# Patient Record
Sex: Female | Born: 1960 | Race: White | Hispanic: No | Smoking: Never smoker
Health system: Southern US, Community
[De-identification: ages and names within clinical notes are randomized; demographics above are authoritative.]

## PROBLEM LIST (undated history)

## (undated) DIAGNOSIS — M810 Age-related osteoporosis without current pathological fracture: Secondary | ICD-10-CM

## (undated) DIAGNOSIS — J45909 Unspecified asthma, uncomplicated: Secondary | ICD-10-CM

## (undated) DIAGNOSIS — IMO0001 Reserved for inherently not codable concepts without codable children: Secondary | ICD-10-CM

## (undated) DIAGNOSIS — K5090 Crohn's disease, unspecified, without complications: Secondary | ICD-10-CM

## (undated) DIAGNOSIS — E7800 Pure hypercholesterolemia, unspecified: Secondary | ICD-10-CM

## (undated) DIAGNOSIS — C419 Malignant neoplasm of bone and articular cartilage, unspecified: Secondary | ICD-10-CM

## (undated) DIAGNOSIS — Z8739 Personal history of other diseases of the musculoskeletal system and connective tissue: Secondary | ICD-10-CM

## (undated) DIAGNOSIS — N159 Renal tubulo-interstitial disease, unspecified: Secondary | ICD-10-CM

## (undated) DIAGNOSIS — B009 Herpesviral infection, unspecified: Secondary | ICD-10-CM

## (undated) DIAGNOSIS — Z8619 Personal history of other infectious and parasitic diseases: Secondary | ICD-10-CM

## (undated) DIAGNOSIS — I10 Essential (primary) hypertension: Secondary | ICD-10-CM

## (undated) DIAGNOSIS — Z5189 Encounter for other specified aftercare: Secondary | ICD-10-CM

## (undated) DIAGNOSIS — Z853 Personal history of malignant neoplasm of breast: Secondary | ICD-10-CM

## (undated) DIAGNOSIS — Z8719 Personal history of other diseases of the digestive system: Secondary | ICD-10-CM

## (undated) DIAGNOSIS — K604 Rectal fistula: Secondary | ICD-10-CM

## (undated) DIAGNOSIS — H9192 Unspecified hearing loss, left ear: Secondary | ICD-10-CM

## (undated) DIAGNOSIS — N750 Cyst of Bartholin's gland: Secondary | ICD-10-CM

## (undated) DIAGNOSIS — Z8742 Personal history of other diseases of the female genital tract: Secondary | ICD-10-CM

## (undated) DIAGNOSIS — Z872 Personal history of diseases of the skin and subcutaneous tissue: Secondary | ICD-10-CM

## (undated) DIAGNOSIS — IMO0002 Reserved for concepts with insufficient information to code with codable children: Secondary | ICD-10-CM

## (undated) DIAGNOSIS — E079 Disorder of thyroid, unspecified: Secondary | ICD-10-CM

## (undated) HISTORY — DX: Malignant neoplasm of bone and articular cartilage, unspecified: C41.9

## (undated) HISTORY — DX: Personal history of malignant neoplasm of breast: Z85.3

## (undated) HISTORY — DX: Herpesviral infection, unspecified: B00.9

## (undated) HISTORY — DX: Cyst of Bartholin's gland: N75.0

## (undated) HISTORY — PX: BREAST REDUCTION SURGERY: SHX8

## (undated) HISTORY — PX: BREAST BIOPSY: SHX20

## (undated) HISTORY — DX: Personal history of diseases of the skin and subcutaneous tissue: Z87.2

## (undated) HISTORY — DX: Personal history of other diseases of the female genital tract: Z87.42

## (undated) HISTORY — DX: Age-related osteoporosis without current pathological fracture: M81.0

## (undated) HISTORY — DX: Crohn's disease, unspecified, without complications: K50.90

## (undated) HISTORY — DX: Personal history of other diseases of the digestive system: Z87.19

## (undated) HISTORY — PX: THYROIDECTOMY: SHX17

## (undated) HISTORY — PX: BARTHOLIN CYST MARSUPIALIZATION: SHX5383

## (undated) HISTORY — PX: MASTECTOMY: SHX3

## (undated) HISTORY — DX: Reserved for concepts with insufficient information to code with codable children: IMO0002

## (undated) HISTORY — DX: Rectal fistula: K60.4

## (undated) HISTORY — DX: Renal tubulo-interstitial disease, unspecified: N15.9

## (undated) HISTORY — DX: Encounter for other specified aftercare: Z51.89

## (undated) HISTORY — PX: OTHER SURGICAL HISTORY: SHX169

## (undated) HISTORY — DX: Pure hypercholesterolemia, unspecified: E78.00

## (undated) HISTORY — DX: Reserved for inherently not codable concepts without codable children: IMO0001

## (undated) HISTORY — DX: Personal history of other diseases of the musculoskeletal system and connective tissue: Z87.39

## (undated) HISTORY — PX: ECTOPIC PREGNANCY SURGERY: SHX613

## (undated) HISTORY — DX: Personal history of other infectious and parasitic diseases: Z86.19

## (undated) HISTORY — DX: Unspecified hearing loss, left ear: H91.92

---
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
## 1984-10-06 DIAGNOSIS — K5090 Crohn's disease, unspecified, without complications: Secondary | ICD-10-CM

## 1984-10-06 HISTORY — DX: Crohn's disease, unspecified, without complications: K50.90

## 1989-04-05 DIAGNOSIS — Z8719 Personal history of other diseases of the digestive system: Secondary | ICD-10-CM

## 1989-04-05 HISTORY — DX: Personal history of other diseases of the digestive system: Z87.19

## 1989-04-05 HISTORY — PX: RECTAL SURGERY: SHX760

## 1995-12-07 DIAGNOSIS — Z8719 Personal history of other diseases of the digestive system: Secondary | ICD-10-CM

## 1995-12-07 HISTORY — DX: Personal history of other diseases of the digestive system: Z87.19

## 1996-12-06 DIAGNOSIS — N750 Cyst of Bartholin's gland: Secondary | ICD-10-CM

## 1996-12-06 HISTORY — DX: Cyst of Bartholin's gland: N75.0

## 1997-02-03 HISTORY — PX: OTHER SURGICAL HISTORY: SHX169

## 1997-02-03 HISTORY — PX: SALPINGOOPHORECTOMY: SHX82

## 1997-12-06 HISTORY — PX: OTHER SURGICAL HISTORY: SHX169

## 1998-12-06 DIAGNOSIS — Z872 Personal history of diseases of the skin and subcutaneous tissue: Secondary | ICD-10-CM

## 1998-12-06 HISTORY — DX: Personal history of diseases of the skin and subcutaneous tissue: Z87.2

## 1999-12-07 DIAGNOSIS — K604 Rectal fistula, unspecified: Secondary | ICD-10-CM

## 1999-12-07 DIAGNOSIS — Z8739 Personal history of other diseases of the musculoskeletal system and connective tissue: Secondary | ICD-10-CM

## 1999-12-07 DIAGNOSIS — Z8742 Personal history of other diseases of the female genital tract: Secondary | ICD-10-CM

## 1999-12-07 DIAGNOSIS — H9192 Unspecified hearing loss, left ear: Secondary | ICD-10-CM

## 1999-12-07 HISTORY — DX: Personal history of other diseases of the musculoskeletal system and connective tissue: Z87.39

## 1999-12-07 HISTORY — DX: Rectal fistula: K60.4

## 1999-12-07 HISTORY — DX: Unspecified hearing loss, left ear: H91.92

## 1999-12-07 HISTORY — DX: Personal history of other diseases of the female genital tract: Z87.42

## 1999-12-07 HISTORY — DX: Rectal fistula, unspecified: K60.40

## 2002-12-06 HISTORY — PX: ABDOMINAL HYSTERECTOMY: SHX81

## 2003-08-07 HISTORY — PX: BOWEL RESECTION: SHX1257

## 2007-09-07 ENCOUNTER — Emergency Department (HOSPITAL_COMMUNITY): Admission: EM | Admit: 2007-09-07 | Discharge: 2007-09-07 | Payer: Self-pay | Admitting: Emergency Medicine

## 2008-12-06 DIAGNOSIS — R87619 Unspecified abnormal cytological findings in specimens from cervix uteri: Secondary | ICD-10-CM

## 2008-12-06 DIAGNOSIS — IMO0002 Reserved for concepts with insufficient information to code with codable children: Secondary | ICD-10-CM

## 2008-12-06 HISTORY — DX: Reserved for concepts with insufficient information to code with codable children: IMO0002

## 2008-12-06 HISTORY — DX: Unspecified abnormal cytological findings in specimens from cervix uteri: R87.619

## 2010-12-06 DIAGNOSIS — Z853 Personal history of malignant neoplasm of breast: Secondary | ICD-10-CM

## 2010-12-06 HISTORY — DX: Personal history of malignant neoplasm of breast: Z85.3

## 2011-04-17 ENCOUNTER — Emergency Department (HOSPITAL_BASED_OUTPATIENT_CLINIC_OR_DEPARTMENT_OTHER)
Admission: EM | Admit: 2011-04-17 | Discharge: 2011-04-17 | Disposition: A | Discharge status: Home / Self Care | Payer: BC Managed Care – PPO | Attending: Emergency Medicine | Admitting: Emergency Medicine

## 2011-04-17 ENCOUNTER — Emergency Department (INDEPENDENT_AMBULATORY_CARE_PROVIDER_SITE_OTHER): Payer: BC Managed Care – PPO

## 2011-04-17 DIAGNOSIS — S91109A Unspecified open wound of unspecified toe(s) without damage to nail, initial encounter: Secondary | ICD-10-CM

## 2011-04-17 DIAGNOSIS — W208XXA Other cause of strike by thrown, projected or falling object, initial encounter: Secondary | ICD-10-CM

## 2011-04-27 ENCOUNTER — Emergency Department (HOSPITAL_BASED_OUTPATIENT_CLINIC_OR_DEPARTMENT_OTHER)
Admission: EM | Admit: 2011-04-27 | Discharge: 2011-04-27 | Disposition: A | Discharge status: Home / Self Care | Payer: BC Managed Care – PPO | Attending: Emergency Medicine | Admitting: Emergency Medicine

## 2011-04-27 DIAGNOSIS — Z4802 Encounter for removal of sutures: Secondary | ICD-10-CM | POA: Insufficient documentation

## 2011-05-07 HISTORY — PX: MASTECTOMY: SHX3

## 2011-09-16 LAB — POCT I-STAT CREATININE: Creatinine, Ser: 0.6

## 2011-09-16 LAB — POCT CARDIAC MARKERS
CKMB, poc: 2.1
CKMB, poc: 3.1
Operator id: 277751
Troponin i, poc: <0.05
Troponin i, poc: <0.05

## 2011-09-16 LAB — I-STAT 8, (EC8 V) (CONVERTED LAB)
BUN: 10
Bicarbonate: 28.6 — ABNORMAL HIGH
Glucose, Bld: 99
Potassium: 3.6
Sodium: 141
TCO2: 30
pH, Ven: 7.382 — ABNORMAL HIGH

## 2011-09-16 LAB — DIFFERENTIAL
Basophils Absolute: 0
Basophils Relative: 1
Eosinophils Absolute: 0.1

## 2011-09-16 LAB — CBC
HCT: 39.4
Hemoglobin: 13.6
RBC: 4.35
WBC: 6.5

## 2012-01-03 HISTORY — PX: BREAST RECONSTRUCTION: SHX9

## 2012-03-01 ENCOUNTER — Encounter: Payer: Self-pay | Admitting: Obstetrics and Gynecology

## 2012-03-13 ENCOUNTER — Encounter (INDEPENDENT_AMBULATORY_CARE_PROVIDER_SITE_OTHER): Payer: BC Managed Care – PPO | Admitting: Obstetrics and Gynecology

## 2012-03-13 ENCOUNTER — Other Ambulatory Visit: Payer: Self-pay | Admitting: Obstetrics and Gynecology

## 2012-03-13 DIAGNOSIS — Z01419 Encounter for gynecological examination (general) (routine) without abnormal findings: Secondary | ICD-10-CM

## 2012-03-13 DIAGNOSIS — Z139 Encounter for screening, unspecified: Secondary | ICD-10-CM

## 2012-03-14 LAB — TSH: TSH: 1.862 u[IU]/mL (ref 0.350–4.500)

## 2012-03-27 ENCOUNTER — Telehealth: Payer: Self-pay

## 2012-03-27 NOTE — Telephone Encounter (Signed)
Call pt. Re: VIT-D protocol, rx was called to [facility].PG CMA

## 2012-03-27 NOTE — Telephone Encounter (Signed)
Pt.was called re: VI-D protocol, rx was called to [facility] for VIT-D 50,000 units 1cap 1x wk for 12 wks #20 0rf PG CMA

## 2012-04-27 ENCOUNTER — Other Ambulatory Visit: Payer: Self-pay | Admitting: Endocrinology

## 2012-04-27 DIAGNOSIS — E049 Nontoxic goiter, unspecified: Secondary | ICD-10-CM

## 2012-05-03 ENCOUNTER — Ambulatory Visit: Payer: Self-pay | Admitting: Obstetrics and Gynecology

## 2012-05-05 ENCOUNTER — Ambulatory Visit
Admission: RE | Admit: 2012-05-05 | Discharge: 2012-05-05 | Disposition: A | Discharge status: Home / Self Care | Payer: BC Managed Care – PPO | Source: Ambulatory Visit | Attending: Endocrinology | Admitting: Endocrinology

## 2012-05-05 DIAGNOSIS — E049 Nontoxic goiter, unspecified: Secondary | ICD-10-CM

## 2012-06-12 ENCOUNTER — Telehealth: Payer: Self-pay

## 2012-06-12 NOTE — Telephone Encounter (Signed)
Spoke to  Pt about Vit. D recall. She is going to check to see if Vit D level was checked on recent labs through [facility]. We"ll talk again tomorrow and sched f/u if need be. Also pt was asking about thyroid u/s. Results. Biopsy is being recommended. Pt has not heard any results from Dr. Talmage Nap or our office. I told her I'd look to see if I have any result notes from [facility] re: results and rec. And get back to her. Melody Comas A

## 2012-06-15 ENCOUNTER — Other Ambulatory Visit: Payer: Self-pay

## 2012-06-15 DIAGNOSIS — E559 Vitamin D deficiency, unspecified: Secondary | ICD-10-CM

## 2012-06-26 ENCOUNTER — Other Ambulatory Visit: Payer: BC Managed Care – PPO

## 2012-06-26 DIAGNOSIS — E559 Vitamin D deficiency, unspecified: Secondary | ICD-10-CM

## 2012-06-27 ENCOUNTER — Other Ambulatory Visit: Payer: Self-pay | Admitting: Endocrinology

## 2012-06-27 DIAGNOSIS — E041 Nontoxic single thyroid nodule: Secondary | ICD-10-CM

## 2012-06-27 LAB — VITAMIN D 25 HYDROXY (VIT D DEFICIENCY, FRACTURES): Vit D, 25-Hydroxy: 29 ng/mL — ABNORMAL LOW (ref 30–89)

## 2012-07-04 ENCOUNTER — Other Ambulatory Visit (HOSPITAL_COMMUNITY)
Admission: RE | Admit: 2012-07-04 | Discharge: 2012-07-04 | Disposition: A | Discharge status: Home / Self Care | Payer: BC Managed Care – PPO | Source: Ambulatory Visit | Attending: Interventional Radiology | Admitting: Interventional Radiology

## 2012-07-04 ENCOUNTER — Ambulatory Visit
Admission: RE | Admit: 2012-07-04 | Discharge: 2012-07-04 | Disposition: A | Discharge status: Home / Self Care | Payer: BC Managed Care – PPO | Source: Ambulatory Visit | Attending: Endocrinology | Admitting: Endocrinology

## 2012-07-04 DIAGNOSIS — E041 Nontoxic single thyroid nodule: Secondary | ICD-10-CM

## 2012-07-04 DIAGNOSIS — E049 Nontoxic goiter, unspecified: Secondary | ICD-10-CM | POA: Insufficient documentation

## 2012-07-24 ENCOUNTER — Telehealth: Payer: Self-pay

## 2012-07-24 NOTE — Telephone Encounter (Signed)
LM for pt to CB re labb tests. She needs Vitamin D protocol.  I gave her my direct ext. Melody Comas A

## 2012-07-25 ENCOUNTER — Telehealth: Payer: Self-pay

## 2012-07-25 NOTE — Telephone Encounter (Signed)
Took call from pt who was calling for test results. Vit D still low at 29. She has been on Vitamin D protocol. Her result was the same in April.She actually has thyroid cancer now and wonders whether the cancer is affecting her ability to respond to the Vitamin D regimen. I told her to direct that ? To her Endocrinologist. Pt is agreeable. Melody Comas A

## 2013-02-20 IMAGING — CR DG FOOT COMPLETE 3+V*L*
3 series · 3 of 3 positions shown · non-contrast
Comparison: None.

CLINICAL DATA: Blunt trauma to the left second toe.

LEFT FOOT - COMPLETE 3+ VIEW

[t foot ap left]
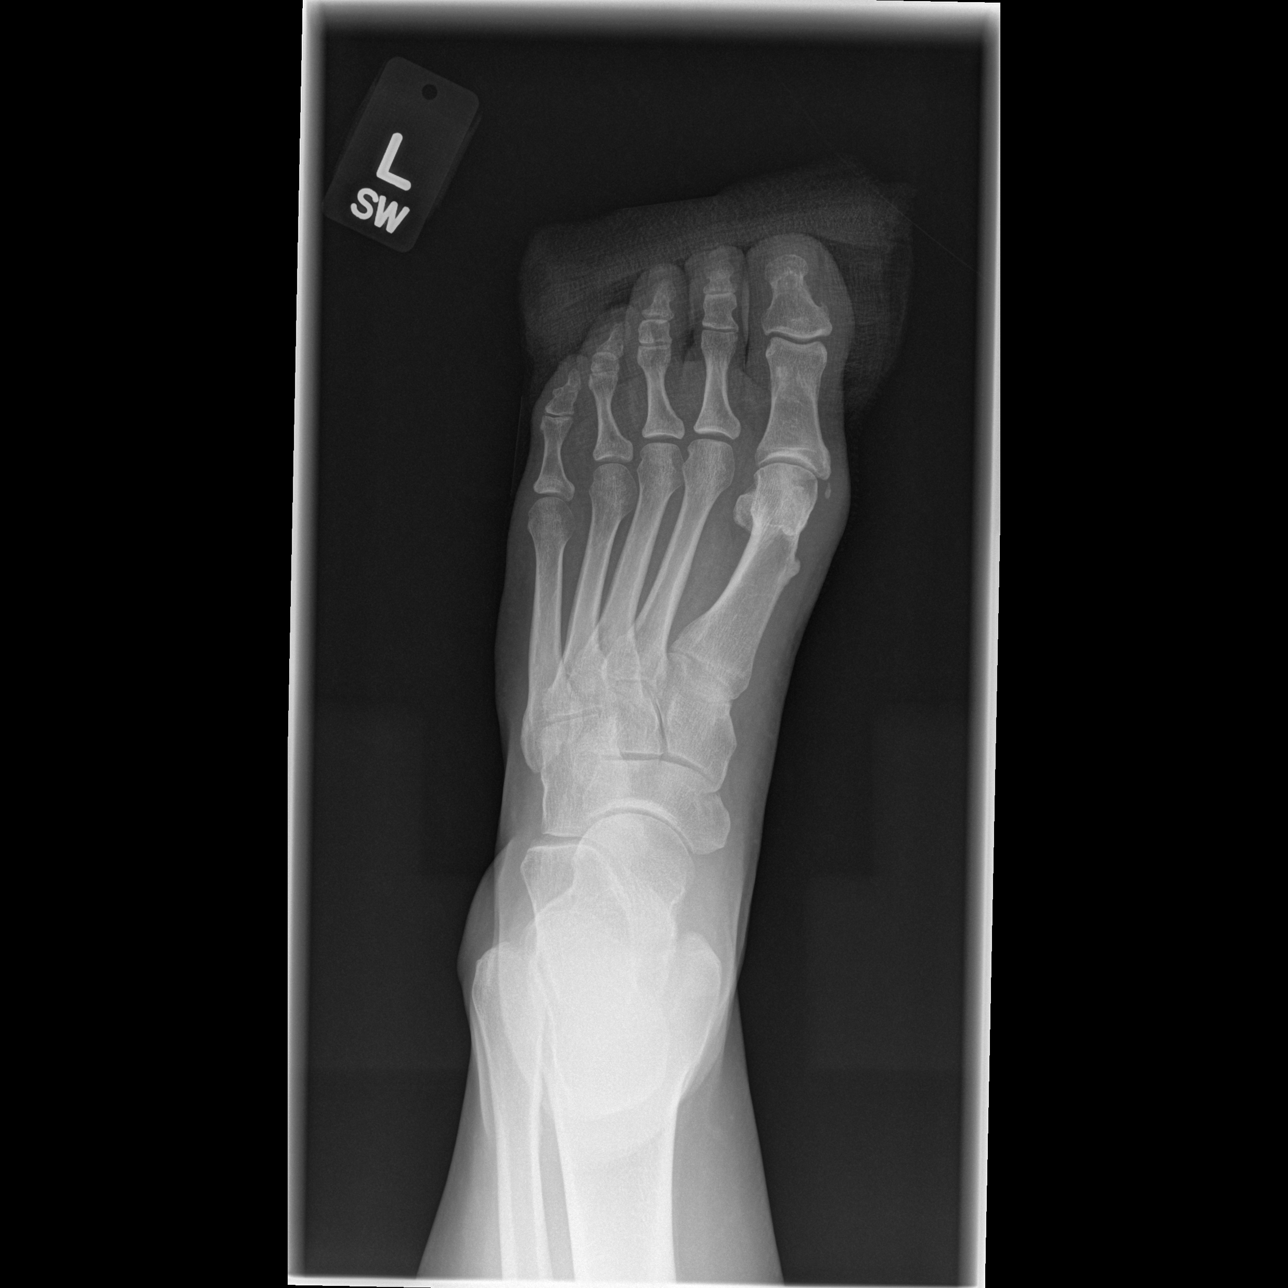

[t foot oblique left]
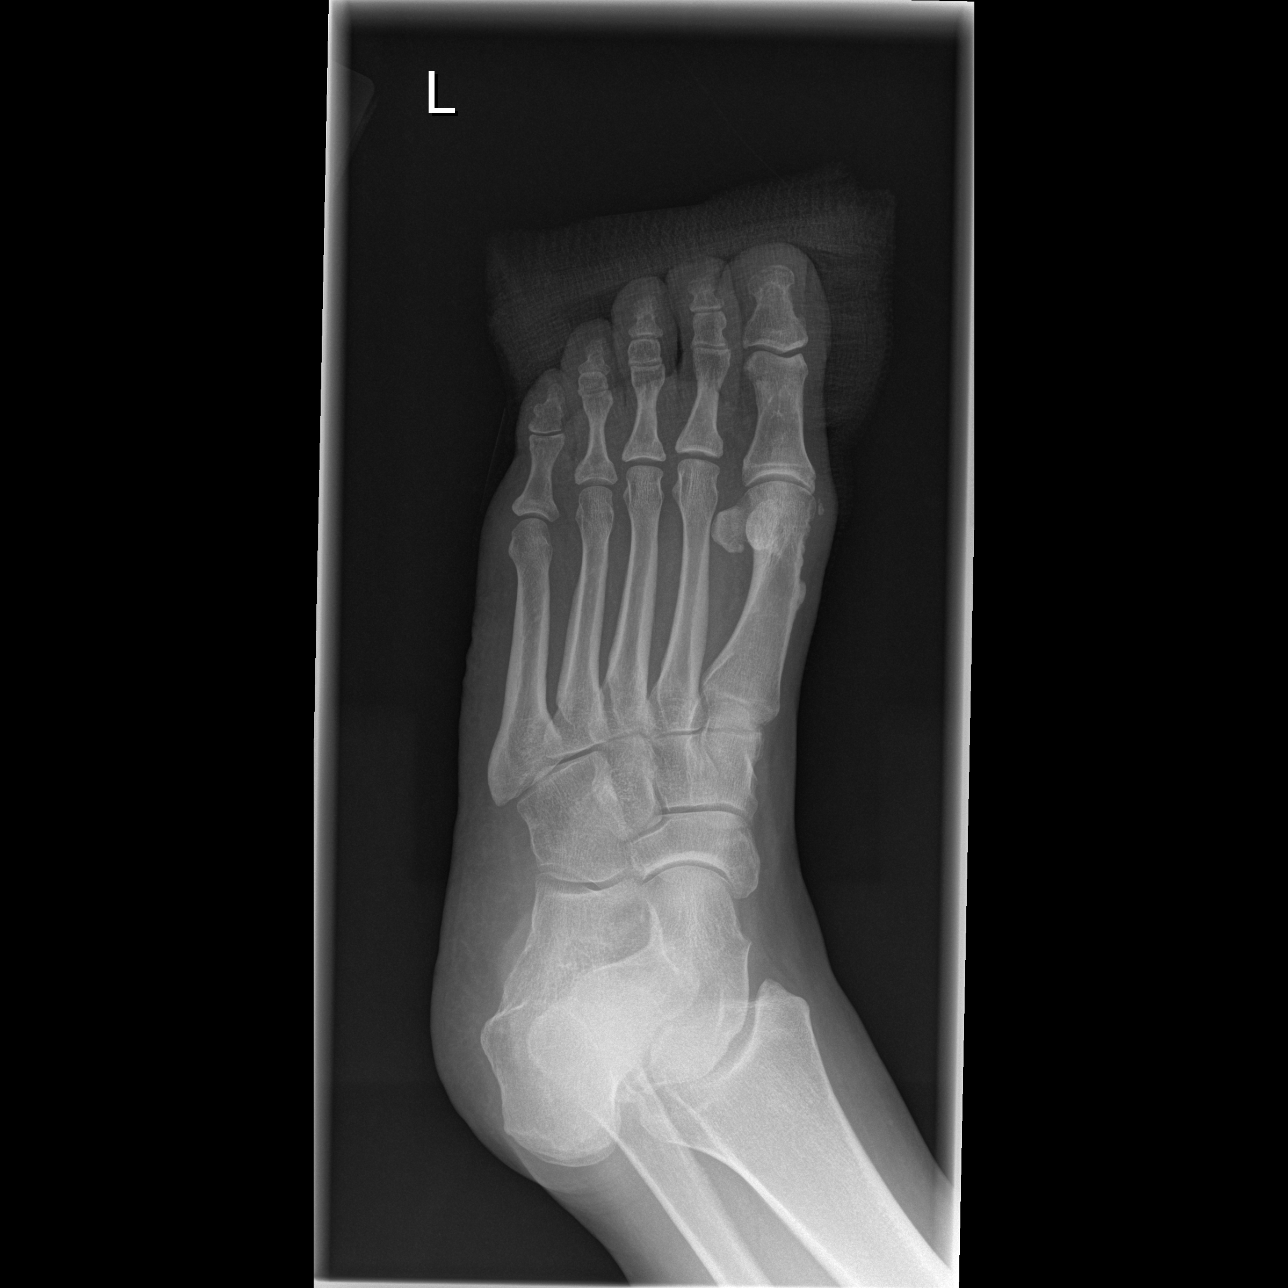

[t foot lat left]
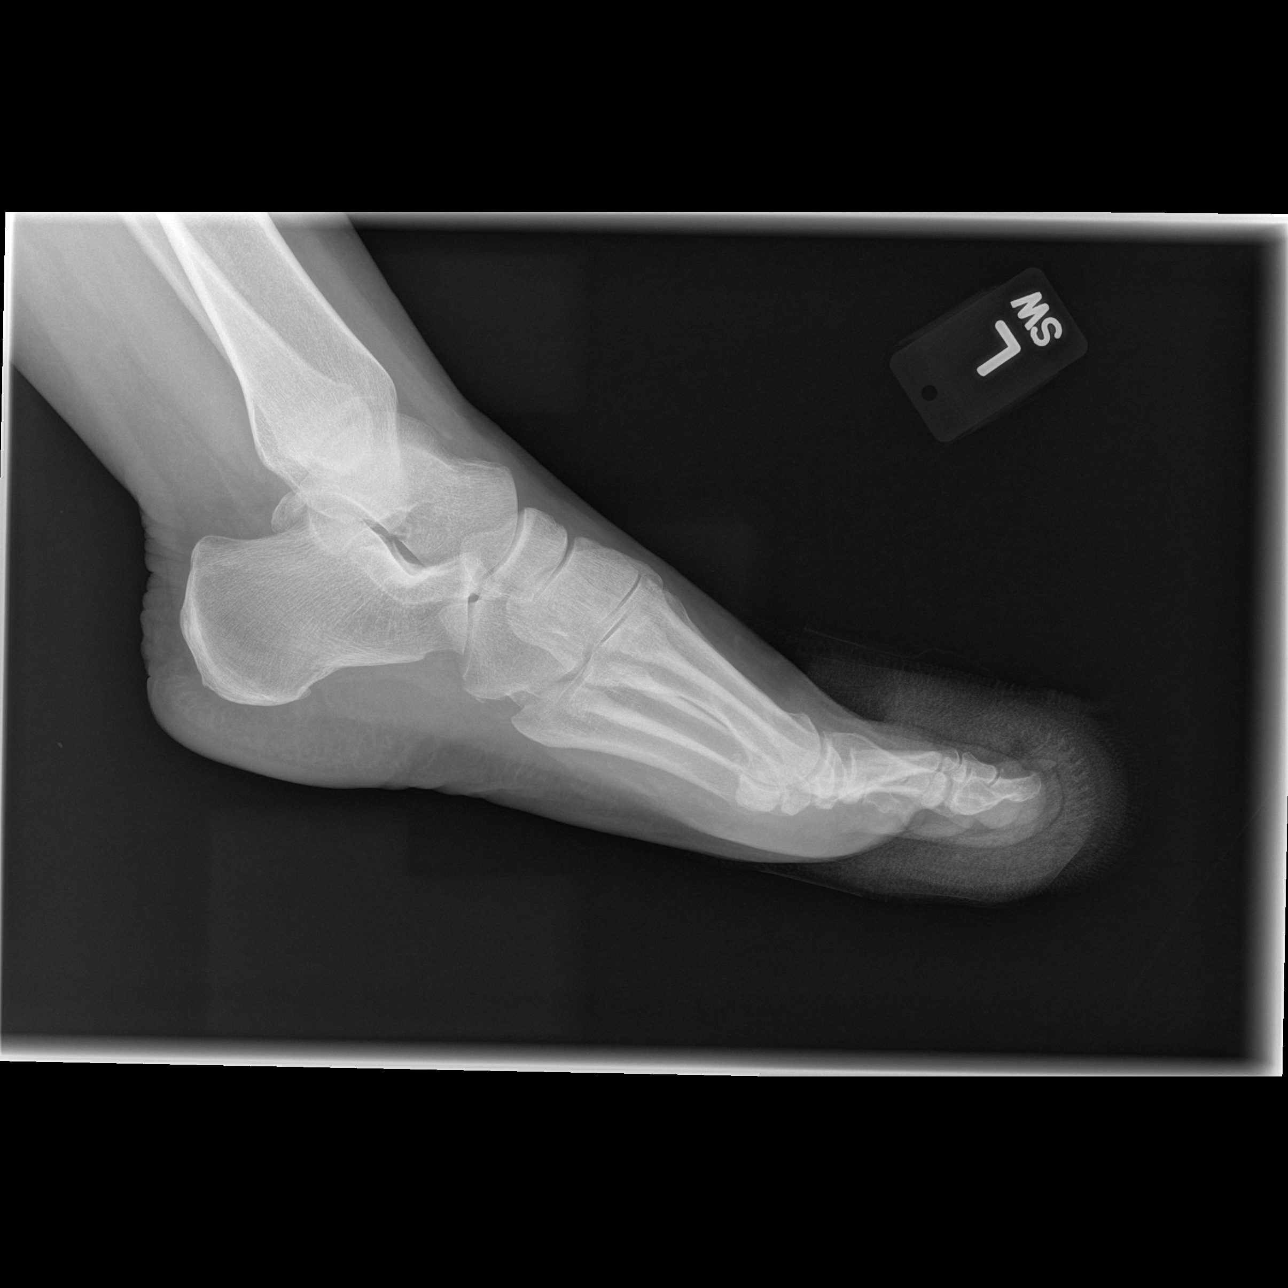

[3 of 3 positions shown; findings below may reference images not displayed]

FINDINGS: There is no evidence for acute fracture.  No subluxation or
dislocation.  Postoperative changes are seen in the first
metatarsal.  Soft tissue irregularity at the tip of the second toe
is probably secondary to a laceration.
IMPRESSION: Fine detail is obscured by the overlying gauze.  No gross fracture
or dislocation is evident.

## 2013-03-29 IMAGING — US US SOFT TISSUE HEAD/NECK
1 series · 13 of 25 positions shown · non-contrast
Comparison: CT chest of 09/07/2007

CLINICAL DATA: Enlarged thyroid on physical exam

THYROID ULTRASOUND
TECHNIQUE: Ultrasound examination of the thyroid gland and adjacent
soft tissues was performed.

[Series 1: us soft tissue head/neck · 0.05mm/px · 13 of 62 slices shown]
[im 1/62]
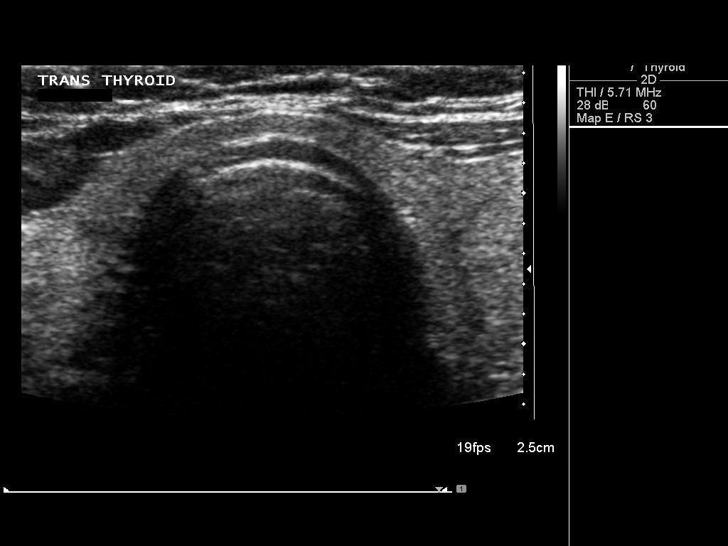
[im 6/62]
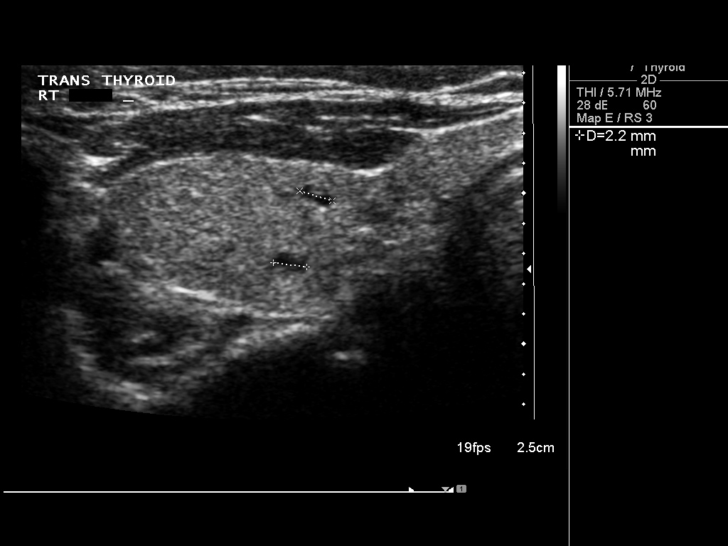
[im 11/62]
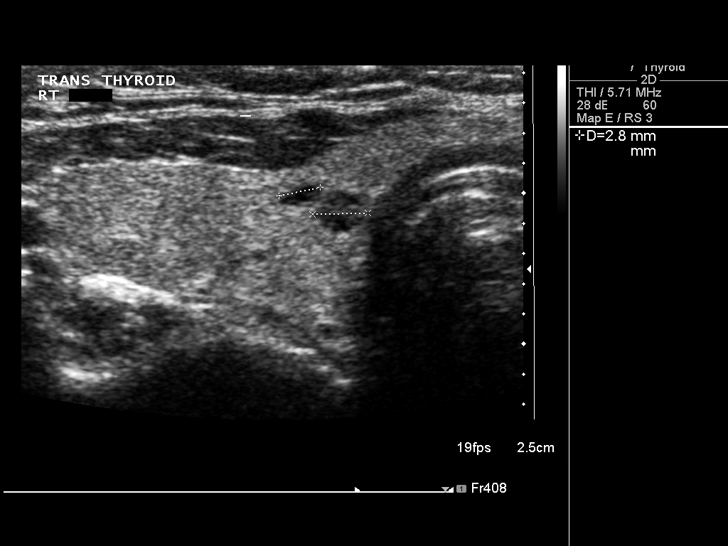
[im 16/62]
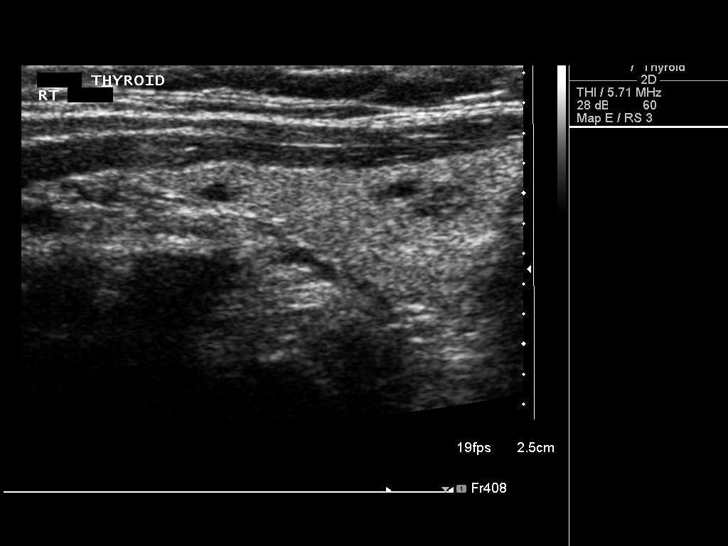
[im 21/62]
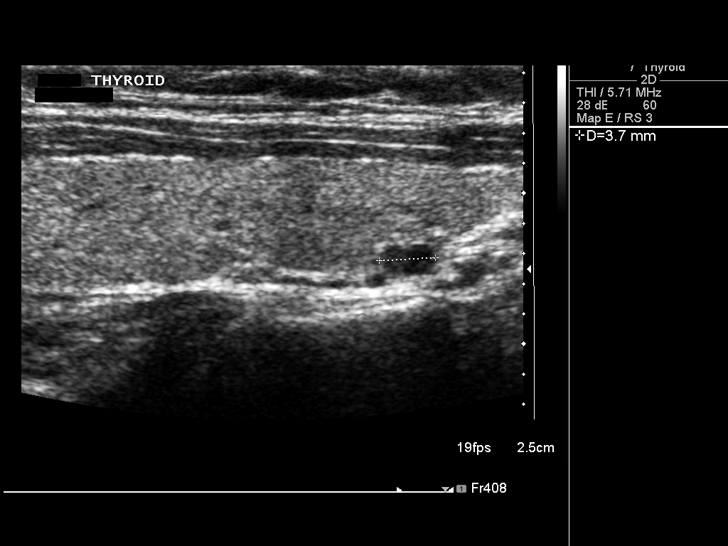
[im 26/62]
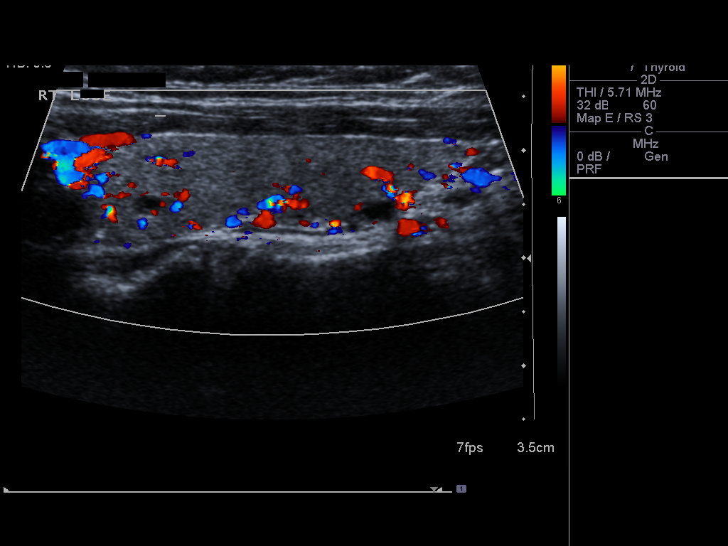
[im 31/62]
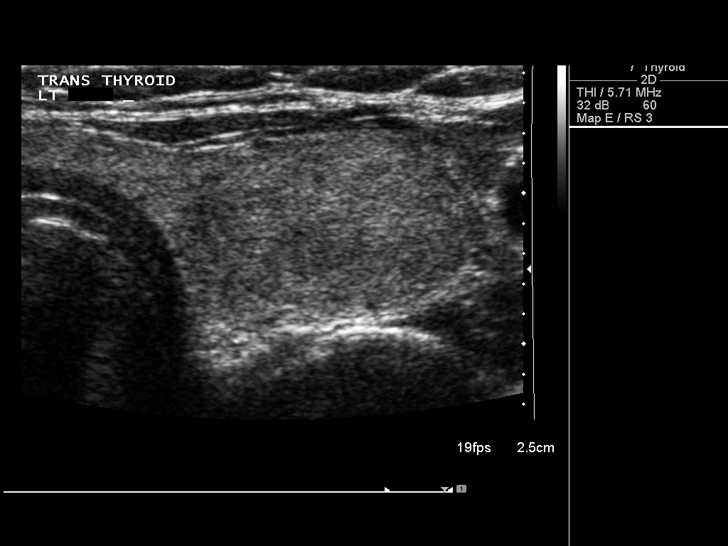
[im 36/62]
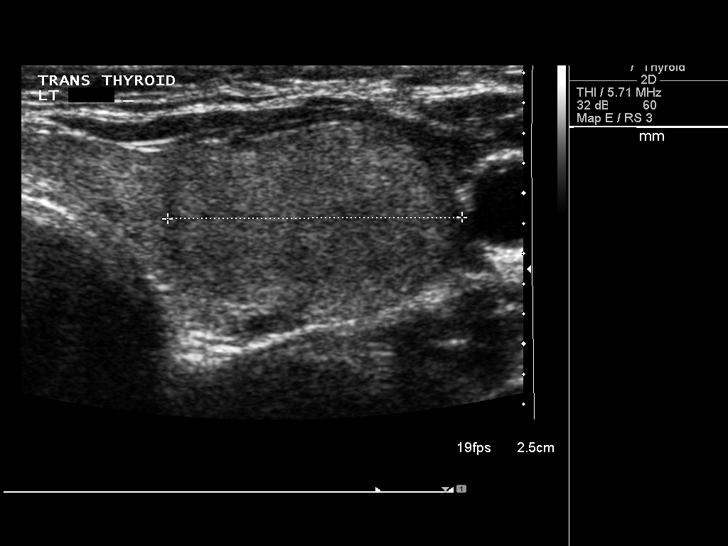
[im 41/62]
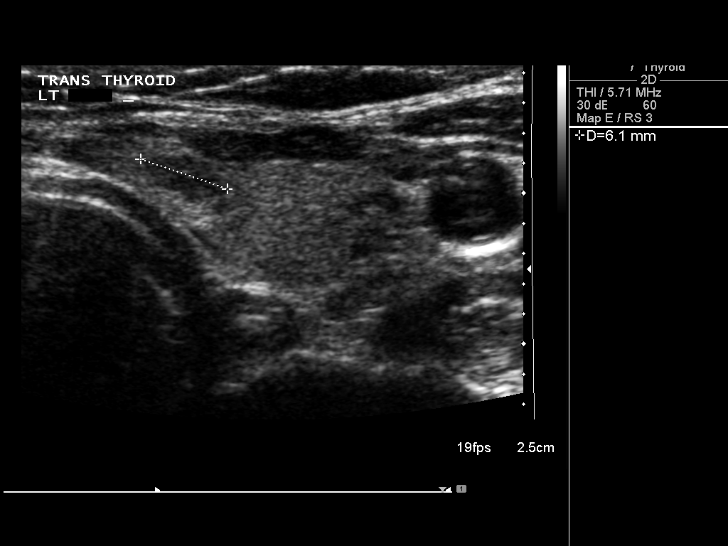
[im 46/62]
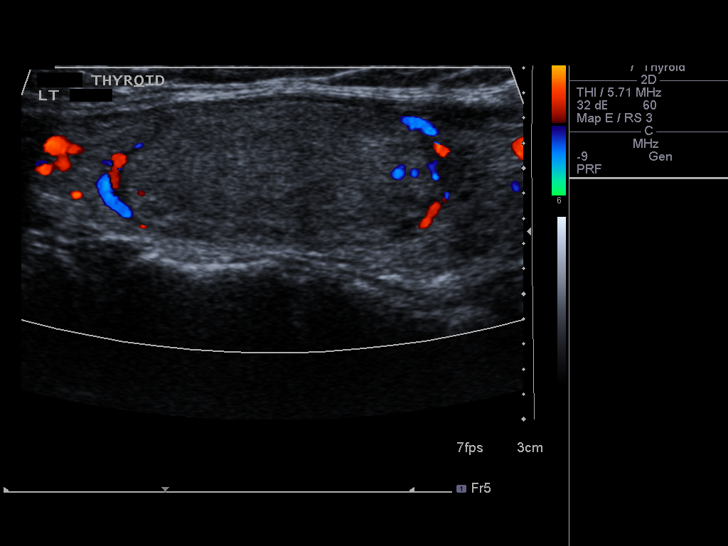
[im 51/62]
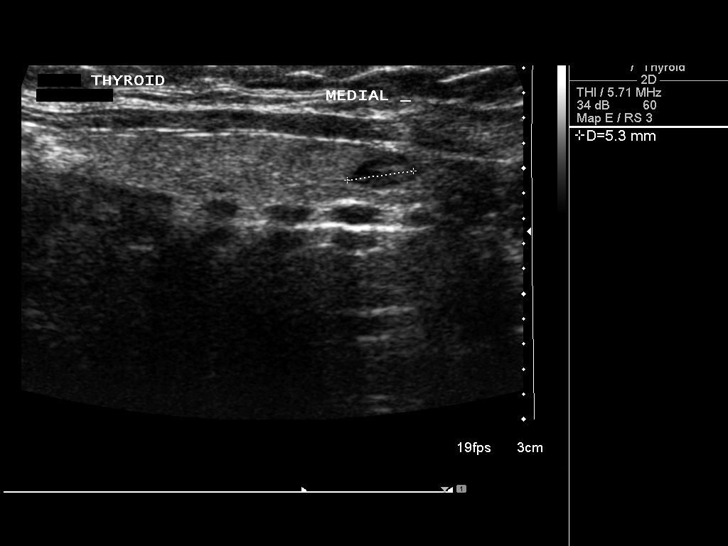
[im 56/62]
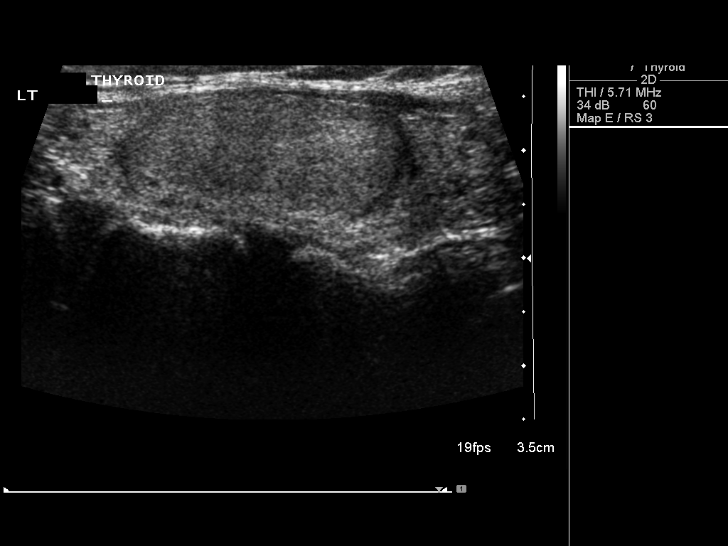
[im 62/62]
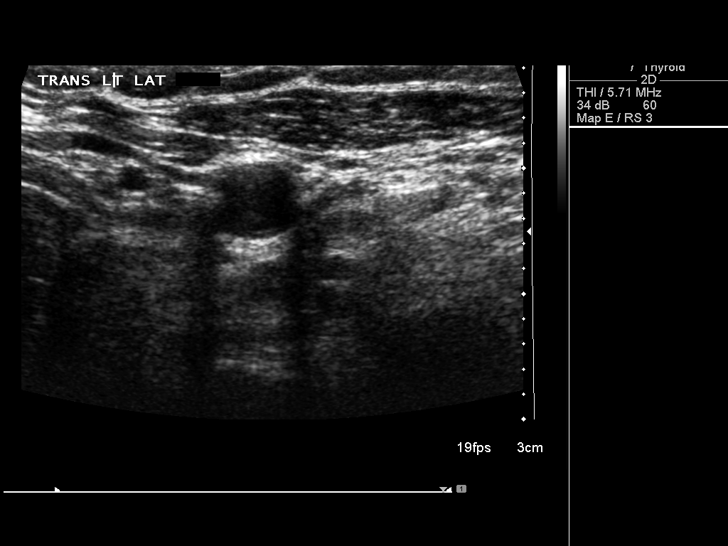

[13 of 25 positions shown; findings below may reference images not displayed]

FINDINGS: Right thyroid lobe:  4.4 x 1.0 x 2.2 cm.
Left thyroid lobe:  4.2 x 1.2 x 2.2 cm.
Isthmus:  2.3 mm in thickness.

Focal nodules:  The echogenicity of the thyroid gland is
homogeneous.  There is a dominant solid nodule within the left lobe
of thyroid of 2.7 x 1.2 x 2.0 cm.  In review of the CT from 3226,
this thyroid nodule was present at that time measuring 1.4 x 1.5 x
2.1 cm. Findings meet consensus criteria for biopsy.  Ultrasound-
guided fine needle aspiration should be considered, as per the
consensus statement: Management of Thyroid Nodules Detected at US:
Society of Radiologists in Ultrasound Consensus Conference
Statement.  Radiology 3005; [DATE]. Biopsy of this thyroid
nodule is recommended.  Smaller nodules are present bilaterally of
no more than 6 mm in diameter and are largely hypoechoic.

Lymphadenopathy:  None visualized.
IMPRESSION: Solid dominant nodule in the left lobe of 2.7 x 1.2 x 2.0 cm.
Recommend biopsy of this dominant lesion.

## 2013-05-28 IMAGING — US US THYROID BIOPSY
1 series · 12 of 12 positions shown · non-contrast
Comparison: none

CLINICAL DATA: Palpable left thyroid lesion, solid on ultrasound..

ULTRASOUND-GUIDED THYROID ASPIRATION BIOPSY
TECHNIQUE: Survey ultrasound was performed and the dominant lesion
in the mid-left lobe was localized.  An appropriate skin entry site
was determined.  Skin was marked, then prepped with Betadine,
draped in usual sterile fashion, and infiltrated locally with 1%
lidocaine.  Under real-time ultrasound guidance, 4  passes were
made into the lesion with 25 gauge needles.  The patient tolerated
procedure well, with no immediate complications.
IMPRESSION
1.  Technically successful ultrasound-guided thyroid aspiration
biopsy

[Series 1: us thyroid biopsy · 0.05mm/px · 12 acquisitions, 12 frames shown]
[im 1/12]
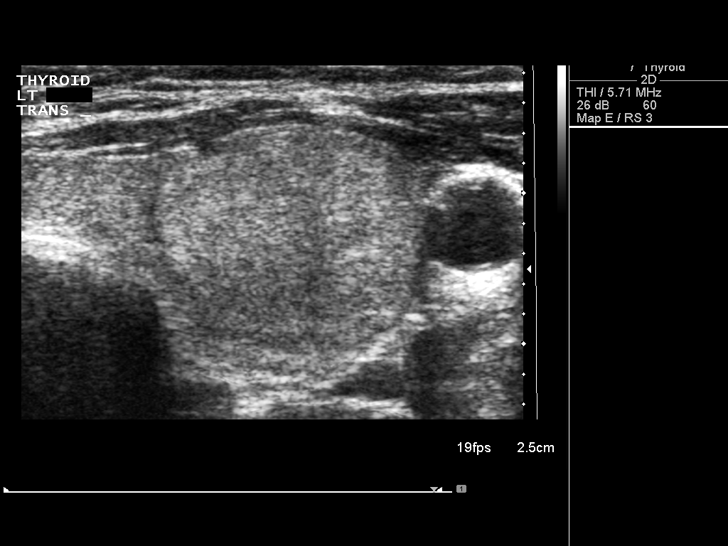
[im 2/12]
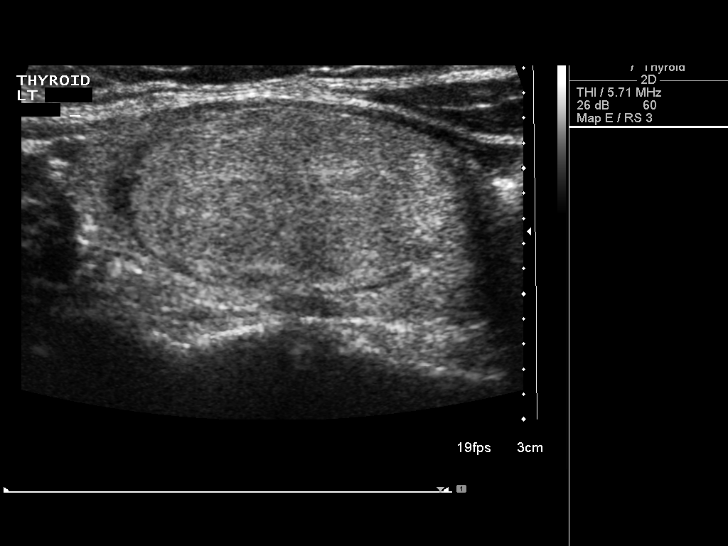
[im 3/12]
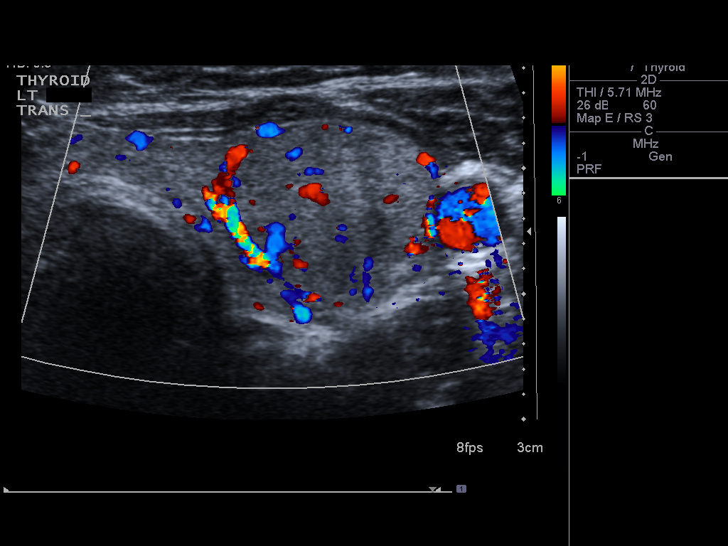
[im 4/12]
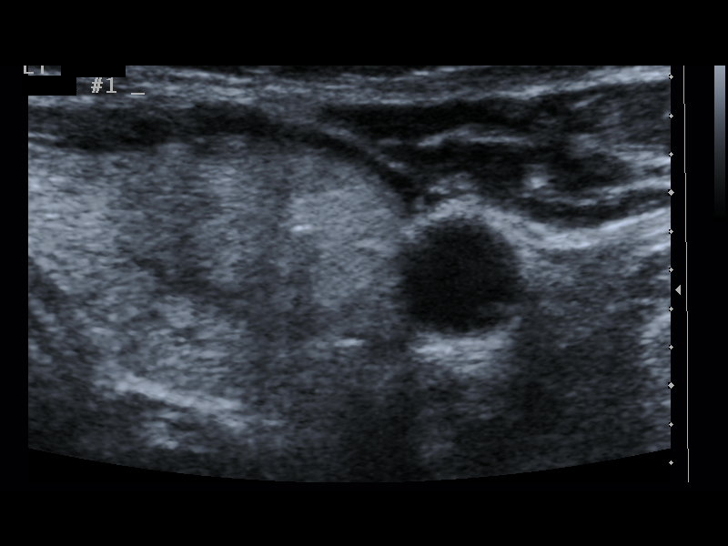
[im 5/12]
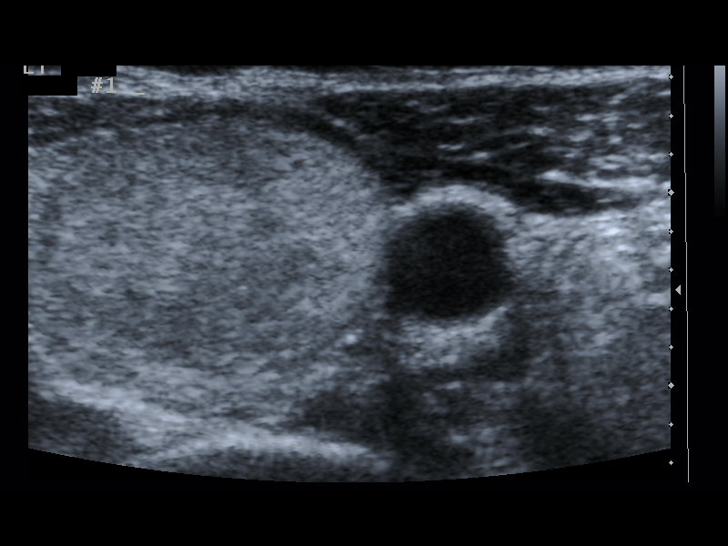
[im 6/12]
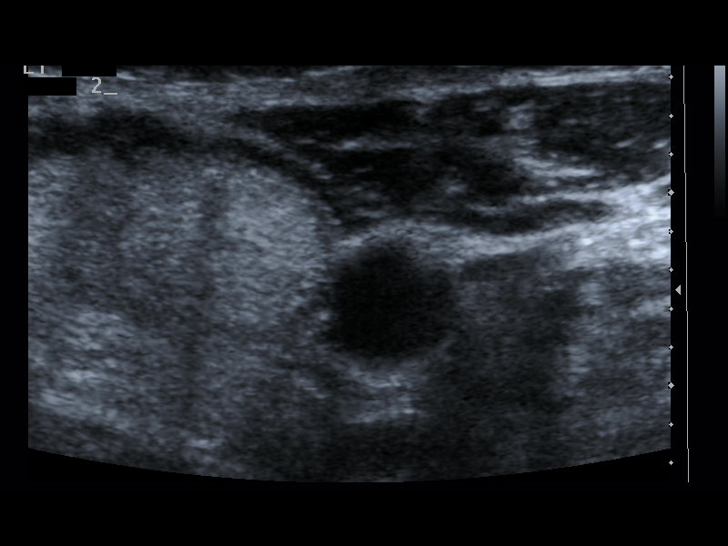
[im 7/12]
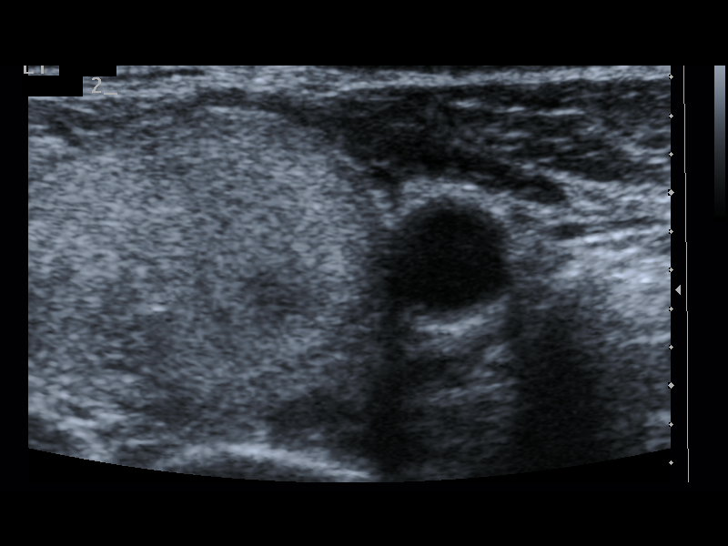
[im 8/12]
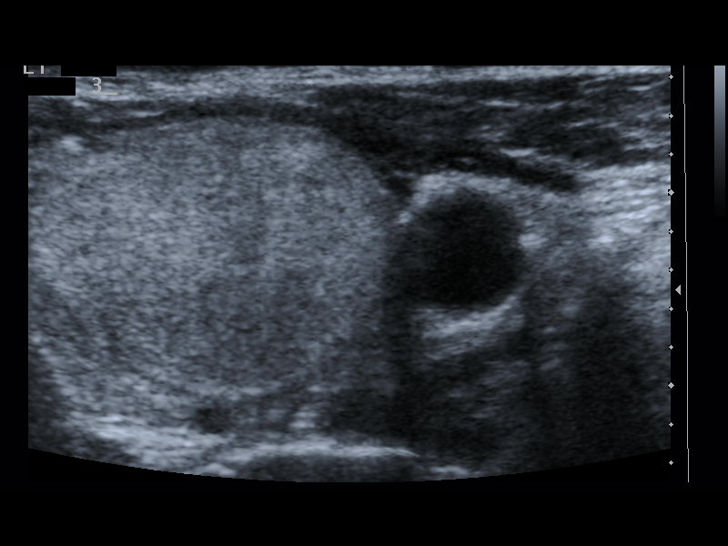
[im 9/12]
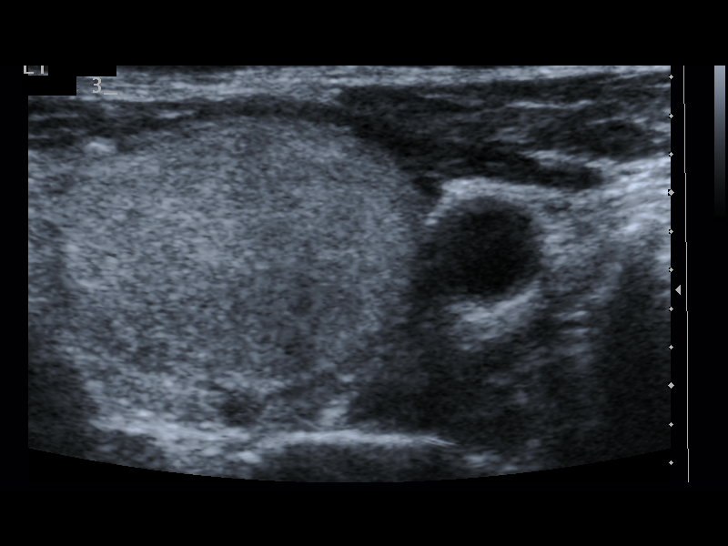
[im 10/12]
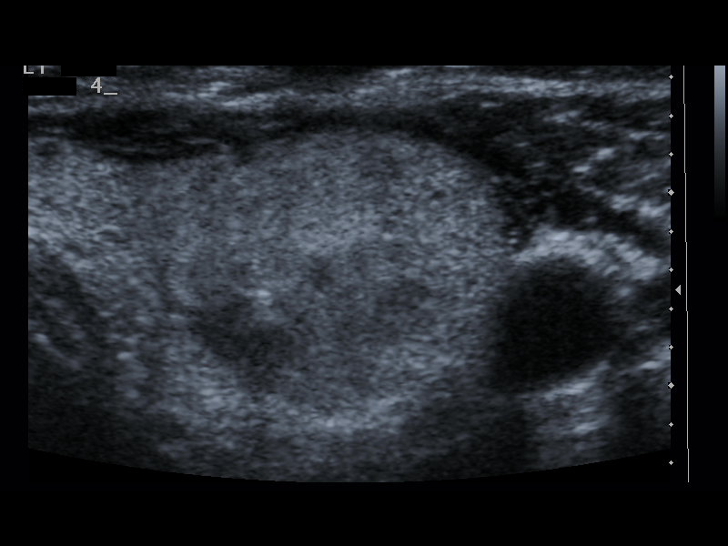
[im 11/12]
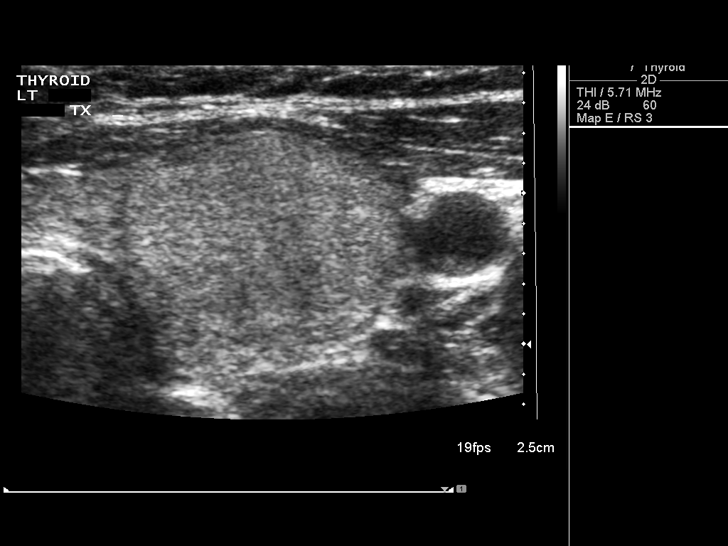
[im 12/12]
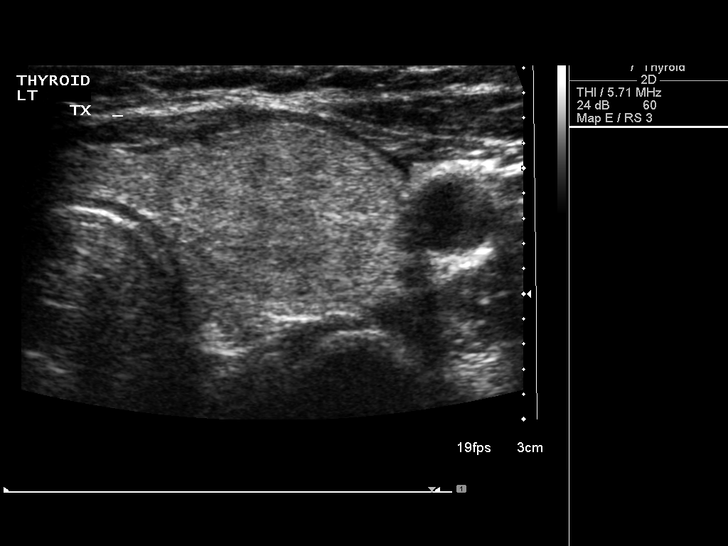

[12 of 12 positions shown; findings below may reference images not displayed]

## 2013-06-21 ENCOUNTER — Encounter (INDEPENDENT_AMBULATORY_CARE_PROVIDER_SITE_OTHER): Payer: Self-pay | Admitting: General Surgery

## 2013-06-21 ENCOUNTER — Ambulatory Visit (INDEPENDENT_AMBULATORY_CARE_PROVIDER_SITE_OTHER): Payer: BC Managed Care – PPO | Admitting: General Surgery

## 2013-06-21 VITALS — BP 130/72 | HR 72 | Temp 97.8°F | Resp 16 | Ht 64.0 in | Wt 169.8 lb

## 2013-06-21 DIAGNOSIS — K644 Residual hemorrhoidal skin tags: Secondary | ICD-10-CM

## 2013-06-21 NOTE — Patient Instructions (Addendum)
Call our office when you are ready to schedule [phone]  GETTING TO GOOD BOWEL HEALTH. Irregular bowel habits such as constipation and diarrhea can lead to many problems over time.  Having one soft bowel movement a day is the most important way to prevent further problems.  The anorectal canal is designed to handle stretching and feces to safely manage our ability to get rid of solid waste (feces, poop, stool) out of our body.  BUT, hard constipated stools can act like ripping concrete bricks and diarrhea can be a burning fire to this very sensitive area of our body, causing inflamed hemorrhoids, anal fissures, increasing risk is perirectal abscesses, abdominal pain/bloating, an making irritable bowel worse.     The goal: ONE SOFT BOWEL MOVEMENT A DAY!  To have soft, regular bowel movements:    Drink at least 8 tall glasses of water a day.     Take plenty of fiber.  Fiber is the undigested part of plant food that passes into the colon, acting s "natures broom" to encourage bowel motility and movement.  Fiber can absorb and hold large amounts of water. This results in a larger, bulkier stool, which is soft and easier to pass. Work gradually over several weeks up to 6 servings a day of fiber (25g a day even more if needed) in the form of: o Vegetables -- Root (potatoes, carrots, turnips), leafy green (lettuce, salad greens, celery, spinach), or cooked high residue (cabbage, broccoli, etc) o Fruit -- Fresh (unpeeled skin & pulp), Dried (prunes, apricots, cherries, etc ),  or stewed ( applesauce)  o Whole grain breads, pasta, etc (whole wheat)  o Bran cereals    Bulking Agents -- This type of water-retaining fiber generally is easily obtained each day by one of the following:  o Psyllium bran -- The psyllium plant is remarkable because its ground seeds can retain so much water. This product is available as Metamucil, Konsyl, Effersyllium, Per Diem Fiber, or the less expensive generic preparation in drug and  health food stores. Although labeled a laxative, it really is not a laxative.  o Methylcellulose -- This is another fiber derived from wood which also retains water. It is available as Citrucel. o Polyethylene Glycol - and "artificial" fiber commonly called Miralax or Glycolax.  It is helpful for people with gassy or bloated feelings with regular fiber o Flax Seed - a less gassy fiber than psyllium   No reading or other relaxing activity while on the toilet. If bowel movements take longer than 5 minutes, you are too constipated   AVOID CONSTIPATION.  High fiber and water intake usually takes care of this.  Sometimes a laxative is needed to stimulate more frequent bowel movements, but    Laxatives are not a good long-term solution as it can wear the colon out. o Osmotics (Milk of Magnesia, Fleets phosphosoda, Magnesium citrate, MiraLax, GoLytely) are safer than  o Stimulants (Senokot, Castor Oil, Dulcolax, Ex Lax)    o Do not take laxatives for more than 7days in a row.    IF SEVERELY CONSTIPATED, try a Bowel Retraining Program: o Do not use laxatives.  o Eat a diet high in roughage, such as bran cereals and leafy vegetables.  o Drink six (6) ounces of prune or apricot juice each morning.  o Eat two (2) large servings of stewed fruit each day.  o Take one (1) heaping tablespoon of a psyllium-based bulking agent twice a day. Use sugar-free sweetener when possible to  avoid excessive calories.  o Eat a normal breakfast.  o Set aside 15 minutes after breakfast to sit on the toilet, but do not strain to have a bowel movement.  o If you do not have a bowel movement by the third day, use an enema and repeat the above steps.

## 2013-06-21 NOTE — Progress Notes (Signed)
Patient ID: Meredith Wu, female   DOB: 1961/03/30, 52 y.o.   MRN: [id number]  Chief Complaint  Patient presents with  . New Evaluation    eval hems    HPI Meredith Wu is a 52 y.o. female.   HPI 52 yo WF referred by Dr Osborn Coho for evaluation of hemorrhoids vs skin tags.  Patient has a history of Crohn's disease and has had prior laparotomies for Crohn's complications. She also has a history of rectal fistulas. However her main complaint is skin tags around her anus. Her last colonoscopy was about 2-1/2 years ago. She has 2-3 formed bowel movements per day. She does not really have any diarrhea. She takes a medication called WelChol which has helped improve and bulk her stools. She denies any pain with defecation. She denies any perianal itching or burning. She denies any hemorrhoids that protrude that require manual reduction. She drinks 6-8 glasses of water a day. She sits on the commode for 1-2 minutes at a time. She may have some transient bleeding on toilet paper when wiping. She does not take any supplemental fiber. She is currently taking medication for Crohn's Past Medical History  Diagnosis Date  . 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 Crohns disease 10/1984  . H/O rosacea 2000  . H/O scoliosis 2001  . Hearing loss in left ear 2001  . Hx of ovarian cyst 2001  . Rectal fistula 2001  . Bartholin cyst 1998  . History of IBS 1997  . HX: breast cancer 2012  . High cholesterol   . History of rectal abscess 04/1989  . Kidney infection   . Blood transfusion   . H/O varicella   . Abnormal Pap smear 2010  . Herpes   . 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 Blood transfusion without reported diagnosis   . Osteoporosis     Past Surgical History  Procedure Laterality Date  . Bunion removal      Left   . Cesarean section    . Ovarian cyst removal  02/1997  . Mastectomy  05/07/11    Bilateral  . Breast reconstruction  01/03/12  . Bowel resection  08/2003  .  Abdominal hysterectomy  2004  . Bartholin cyst drainage  1999    Family History  Problem Relation Age of Onset  . Cancer Mother     breast  . Stroke Mother   . Hypertension Father   . Hyperlipidemia Father   . Diabetes Father   . Heart disease Father     afib    Social History History  Substance Use Topics  . Smoking status: Never Smoker   . Smokeless tobacco: Never Used  . Alcohol Use: Yes    Allergies  Allergen Reactions  . Claritin (Loratadine)   . Talwin (Pentazocine)   . Torecan (Thiethylperazine)     Current Outpatient Prescriptions  Medication Sig Dispense Refill  . B Complex Vitamins (VITAMIN B COMPLEX) TABS Take by mouth.      . bimatoprost (LATISSE) 0.03 % ophthalmic solution Place into both eyes at bedtime. Place one drop on applicator and apply evenly along the skin of the upper eyelid at base of eyelashes once daily at bedtime; repeat procedure for second eye (use a clean applicator).      . Calcium Carb-Cholecalciferol (CALCIUM CARBONATE-VITAMIN D3) 600-400 MG-UNIT TABS Take by mouth.      . colesevelam (WELCHOL) 625 MG tablet Take 1,875 mg by mouth 2 (two) times  daily with a meal.      . ergocalciferol (VITAMIN D2) 50000 UNITS capsule Take 50,000 Units by mouth once a week.      . fish oil-omega-3 fatty acids 1000 MG capsule Take 2 g by mouth daily.      . folic acid (FOLVITE) 400 MCG tablet Take 400 mcg by mouth daily.      Marland Kitchen gabapentin (NEURONTIN) 100 MG capsule Take 100 mg by mouth 3 (three) times daily.      Marland Kitchen Lysine 500 MG TABS Take by mouth.      . Multiple Vitamins-Minerals (MULTIVITAMIN PO) Take by mouth.      . vitamin E (VITAMIN E) 400 UNIT capsule Take 400 Units by mouth daily.      . fenofibrate 160 MG tablet       . fluticasone (FLONASE) 50 MCG/ACT nasal spray       . mercaptopurine (PURINETHOL) 50 MG tablet       . SYNTHROID 175 MCG tablet       . tamoxifen (NOLVADEX) 20 MG tablet        No current facility-administered medications for  this visit.    Review of Systems Review of Systems  Constitutional: Negative for fever, chills and unexpected weight change.  HENT: Positive for hearing loss. Negative for congestion, sore throat, trouble swallowing and voice change.   Eyes: Negative for visual disturbance.  Respiratory: Negative for cough, shortness of breath and wheezing.   Cardiovascular: Negative for chest pain, palpitations and leg swelling.  Gastrointestinal: Positive for anal bleeding. Negative for nausea, vomiting, abdominal pain, diarrhea, constipation and blood in stool.  Genitourinary: Negative for hematuria, vaginal bleeding and difficulty urinating.  Musculoskeletal: Negative for arthralgias.       Rt AKA  Skin: Negative for rash and wound.  Neurological: Negative for seizures, syncope and headaches.  Hematological: Negative for adenopathy. Does not bruise/bleed easily.  Psychiatric/Behavioral: Negative for confusion.    Blood pressure 130/72, pulse 72, temperature 97.8 F (36.6 C), temperature source Temporal, resp. rate 16, height 5\' 4"  (1.626 m), weight 169 lb 12.8 oz (77.021 kg).  Physical Exam Physical Exam  Vitals reviewed. Constitutional: She is oriented to person, place, and time. She appears well-developed and well-nourished. No distress.  HENT:  Head: Normocephalic and atraumatic.  Right Ear: External ear normal.  Left Ear: External ear normal.  Eyes: Conjunctivae are normal. No scleral icterus.  Neck: Normal range of motion. Neck supple. No tracheal deviation present. No thyromegaly present.  Cardiovascular: Normal rate and normal heart sounds.   Pulmonary/Chest: Effort normal and breath sounds normal. No stridor. No respiratory distress. She has no wheezes.  Abdominal: She exhibits no distension. There is no tenderness. There is no rebound and no guarding.    Genitourinary: Rectal exam shows no fissure and anal tone normal.  Has 4 circumferential anal skin tags coming off of  non-thrombosed ext hem tissue. Several of these tags are about 0.5cm long and pedunculated off of the ext hem tissue. Good tone. Anoscopy - no masses. Rt lat grade 1 int hemorrhoid o/w no signif hemorrhoidal burden  Musculoskeletal: She exhibits no edema and no tenderness.  Rt AKA prosthesis  Lymphadenopathy:    She has no cervical adenopathy.  Neurological: She is alert and oriented to person, place, and time. She exhibits normal muscle tone.  Skin: Skin is warm and dry. No rash noted. She is not diaphoretic. No erythema.  Psychiatric: She has a normal mood and affect. Her behavior  is normal. Judgment and thought content normal.    Data Reviewed Dr Su Hilt note  Assessment    Multiple anal skin tags Crohns     Plan    She appears to have multiple anal skin tags emanating from non-thrombosed external hemorrhoidal tissue. Her internal hemorrhoidal burden is not that impressive. The patient was given educational material regarding rectal skin tags. We discussed observation versus surgical excision. We discussed the risk and benefits of surgery including but not limited to bleeding, infection, injury to surrounding structures, blood clot formation, urinary retention, anesthesia complications, scarring, hemorrhoidal flares, abscess formation. We discussed the typical postoperative course. We discussed how it is imperative not become constipated after surgery. We discussed  more than likely she would have some swelling around her anus after surgery. We discussed it would take several weeks for the area to heal. I advised her that the area would never be flat or smooth.  She would like to proceed with surgery however she wants to see her gastroneurologist just make sure everything is okay with respect to her Crohn's disease. She also may have to have some additional breast reconstructive surgery in the future. She may delay surgery with respect to her anal skin tags for a while. I told her that  would be okay. In the interim we discussed instituting a high fiber diet.  She was instructed to contact our office when she would like to proceed with surgery  Mary Sella. Andrey Campanile, MD, FACS General, Bariatric, & Minimally Invasive Surgery [facility], [place]         [facility] M 06/21/2013, 9:24 AM

## 2013-07-26 ENCOUNTER — Other Ambulatory Visit: Payer: Self-pay | Admitting: Physician Assistant

## 2013-08-27 ENCOUNTER — Other Ambulatory Visit: Payer: Self-pay | Admitting: Physician Assistant

## 2013-09-04 ENCOUNTER — Encounter (INDEPENDENT_AMBULATORY_CARE_PROVIDER_SITE_OTHER): Payer: Self-pay

## 2014-09-09 DIAGNOSIS — M1611 Unilateral primary osteoarthritis, right hip: Secondary | ICD-10-CM | POA: Insufficient documentation

## 2014-10-07 ENCOUNTER — Encounter (INDEPENDENT_AMBULATORY_CARE_PROVIDER_SITE_OTHER): Payer: Self-pay | Admitting: General Surgery

## 2015-02-14 ENCOUNTER — Ambulatory Visit: Payer: BC Managed Care – PPO | Attending: Family Medicine | Admitting: Physical Therapy

## 2015-02-14 ENCOUNTER — Encounter: Payer: Self-pay | Admitting: Physical Therapy

## 2015-02-14 DIAGNOSIS — R269 Unspecified abnormalities of gait and mobility: Secondary | ICD-10-CM | POA: Diagnosis present

## 2015-02-14 DIAGNOSIS — M24651 Ankylosis, right hip: Secondary | ICD-10-CM | POA: Diagnosis not present

## 2015-02-14 DIAGNOSIS — R2689 Other abnormalities of gait and mobility: Secondary | ICD-10-CM

## 2015-02-14 DIAGNOSIS — R29818 Other symptoms and signs involving the nervous system: Secondary | ICD-10-CM | POA: Insufficient documentation

## 2015-02-14 DIAGNOSIS — M545 Low back pain: Secondary | ICD-10-CM | POA: Diagnosis not present

## 2015-02-14 NOTE — Therapy (Signed)
[facility] [address] [place], [place], [address] Phone: [phone]   Fax:  [phone]  Physical Therapy Evaluation  Patient Details  Name: Meredith Wu MRN: [id number] Date of Birth: January 28, 1961 Referring Provider:  Burke Keels, MD   Irish Lack., PA-C  Encounter Date: 02/14/2015      PT End of Session - 02/14/15 1100    Visit Number 1   Number of Visits 9   Date for PT Re-Evaluation 05/16/15   PT Start Time 1100   PT Stop Time 1140   PT Time Calculation (min) 40 min   Equipment Utilized During Treatment Gait belt   Activity Tolerance Patient tolerated treatment well   Behavior During Therapy [facility] for tasks assessed/performed      Past Medical History  Diagnosis Date  . 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 Crohns disease 10/1984  . H/O rosacea 2000  . H/O scoliosis 2001  . Hearing loss in left ear 2001  . Hx of ovarian cyst 2001  . Rectal fistula 2001  . Bartholin cyst 1998  . History of IBS 1997  . HX: breast cancer 2012  . High cholesterol   . History of rectal abscess 04/1989  . Kidney infection   . Blood transfusion   . H/O varicella   . Abnormal Pap smear 2010  . Herpes   . 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 Blood transfusion without reported diagnosis   . Osteoporosis     Past Surgical History  Procedure Laterality Date  . Bunion removal      Left   . Cesarean section    . Ovarian cyst removal  02/1997  . Mastectomy  05/07/11    Bilateral  . Breast reconstruction  01/03/12  . Bowel resection  08/2003  . Abdominal hysterectomy  2004  . Bartholin cyst drainage  1999  . Femur biopsy  1980  . 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 Above knee leg amputation  1980    right  . Rectal surgery  04/1989    abscess  . Ectopic pregnancy surgery    . Bartholin cyst marsupialization      drainage  . Salpingoophorectomy Right 02/1997  . Breast biopsy   [id number]  . Mastectomy Bilateral   . Breast reduction surgery Bilateral [id number]  . Thyroidectomy Bilateral [id number]    There were no vitals filed for this visit.  Visit Diagnosis:  Abnormality of gait  Balance problems  Low back pain without sciatica, unspecified back pain laterality  Decreased range of hip movement, right      Subjective Assessment - 02/14/15 1113    Symptoms This 54yo female presents for PT evaluation & improve prosthetic gait.    Patient Stated Goals To strengthen and improve gait.   Currently in Pain? Yes   Pain Score 5   in last week, best 3/10 worst 10/10   Pain Location Back   Pain Descriptors / Indicators Aching;Stabbing   Pain Type Chronic pain   Pain Onset More than a month ago   Pain Frequency Constant   Aggravating Factors  sitting & standing too long   Pain Relieving Factors rest, heat or cold   Multiple Pain Sites Yes   Pain Score 6   Pain Location Groin   Pain Orientation Right   Pain Descriptors / Indicators Pressure;Sharp   Pain Frequency Constant  [facility] PT Assessment - 02/14/15 1100    Assessment   Medical Diagnosis Right Transfemoral Amputation   Precautions   Precautions Fall   Restrictions   Weight Bearing Restrictions No   Balance Screen   Has the patient fallen in the past 6 months Yes   How many times? 1 x/ wk  ankle & wrist strains with falls.   Has the patient had a decrease in activity level because of a fear of falling?  Yes   Is the patient reluctant to leave their home because of a fear of falling?  Yes   [place] Private residence   Living Arrangements Alone   Type of [place] to enter   Entrance Stairs-Number of Steps 1  3"   Home Layout One level   Prior Function   Level of Independence Independent with basic ADLs;Independent with homemaking with ambulation;Independent with gait;Independent with transfers   Vocation Full time employment    Vocation Requirements mainly sits at computer,    Leisure used to travel, yard work   Charity fundraiser Status Within [employer] for tasks assessed   Observation/Other Assessments   Focus on Therapeutic Outcomes (FOTO)  49  Functional Status   Fear Avoidance Belief Questionnaire (FABQ)  19(5)   Posture/Postural Control   Posture/Postural Control Postural limitations   Postural Limitations Rounded Shoulders;Forward head;Increased lumbar lordosis;Right pelvic obliquity;Weight shift left   ROM / Strength   AROM / PROM / Strength PROM;Strength   PROM   Overall PROM  Deficits   PROM Assessment Site Hip   Strength   Right Hip Extension 3+/5   Right Hip ABduction 3+/5   Right Hip   Right Hip Extension -35   Transfers   Sit to Stand 6: Modified independent (Device/Increase time);With upper extremity assist;With armrests;From chair/3-in-1   Stand to Sit 6: Modified independent (Device/Increase time);With upper extremity assist;With armrests;To chair/3-in-1   Ambulation/Gait   Ambulation/Gait Yes   Ambulation/Gait Assistance 5: Supervision;6: Modified independent (Device/Increase time)  needs cues for gait deviations   Ambulation Distance (Feet) 300 Feet   Assistive device Prosthesis   Gait Pattern Step-through pattern;Decreased step length - left;Decreased stance time - right;Decreased hip/knee flexion - right;Decreased weight shift to right;Right hip hike;Lateral trunk lean to right;Lateral hip instability;Trunk rotated posteriorly on right;Trunk flexed;Abducted- right  minimal to no knee flexion in swing, pelvic retraction   Ambulation Surface Level;Indoor   Gait velocity 3.35 ft/sec comfortable  4.62 ft/sec fast pace   Stairs Yes   Stairs Assistance 6: Modified independent (Device/Increase time)   Stair Management Technique Two rails;Step to pattern;Forwards  current knee does not have stance hydraulics so step-to   Number of Stairs 4   Berg Balance Test   Sit to  Stand Able to stand  independently using hands   Standing Unsupported Able to stand safely 2 minutes   Sitting with Back Unsupported but Feet Supported on Floor or Stool Able to sit safely and securely 2 minutes   Stand to Sit Controls descent by using hands   Transfers Able to transfer safely, minor use of hands   Standing Unsupported with Eyes Closed Able to stand 10 seconds with supervision   Standing Ubsupported with Feet Together Able to place feet together independently and stand for 1 minute with supervision   From Standing, Reach Forward with Outstretched Arm Can reach confidently >25 cm (10")   From Standing Position, Pick up Object  from Floor Able to pick up shoe, needs supervision   From Standing Position, Turn to Look Behind Over each Shoulder Looks behind from both sides and weight shifts well   Turn 360 Degrees Able to turn 360 degrees safely but slowly   Standing Unsupported, Alternately Place Feet on Step/Stool Able to complete 4 steps without aid or supervision   Standing Unsupported, One Foot in Front Able to take small step independently and hold 30 seconds   Standing on One Leg Able to lift leg independently and hold > 10 seconds   Total Score 45   Dynamic Gait Index   Level Surface Mild Impairment   Change in Gait Speed Mild Impairment   Gait with Horizontal Head Turns Moderate Impairment   Gait with Vertical Head Turns Moderate Impairment   Gait and Pivot Turn Mild Impairment   Step Over Obstacle Moderate Impairment   Step Around Obstacles Mild Impairment   Steps Moderate Impairment   Total Score 12   Timed Up and Go Test   Normal TUG (seconds) 10.4   Functional Gait  Assessment   Gait assessed  Yes   Gait Level Surface Walks 20 ft in less than 5.5 sec, no assistive devices, good speed, no evidence for imbalance, normal gait pattern, deviates no more than 6 in outside of the 12 in walkway width.   Change in Gait Speed Able to change speed, demonstrates mild gait  deviations, deviates 6-10 in outside of the 12 in walkway width, or no gait deviations, unable to achieve a major change in velocity, or uses a change in velocity, or uses an assistive device.   Gait with Horizontal Head Turns Performs head turns with moderate changes in gait velocity, slows down, deviates 10-15 in outside 12 in walkway width but recovers, can continue to walk.   Gait with Vertical Head Turns Performs task with moderate change in gait velocity, slows down, deviates 10-15 in outside 12 in walkway width but recovers, can continue to walk.   Gait and Pivot Turn Pivot turns safely in greater than 3 sec and stops with no loss of balance, or pivot turns safely within 3 sec and stops with mild imbalance, requires small steps to catch balance.   Step Over Obstacle Is able to step over one shoe box (4.5 in total height) but must slow down and adjust steps to clear box safely. May require verbal cueing.   Gait with Narrow Base of Support Ambulates less than 4 steps heel to toe or cannot perform without assistance.   Gait with Eyes Closed Cannot walk 20 ft without assistance, severe gait deviations or imbalance, deviates greater than 15 in outside 12 in walkway width or will not attempt task.   Ambulating Backwards Walks 20 ft, slow speed, abnormal gait pattern, evidence for imbalance, deviates 10-15 in outside 12 in walkway width.   Steps Two feet to a stair, must use rail.   Total Score 12                             PT Short Term Goals - 02/14/15 1100    PT SHORT TERM GOAL #1   Title demonstrates understanding of initial exercise program. (Target Date: 5th visit)   Status New   PT SHORT TERM GOAL #2   Title ambulates 400' with LRAD with hip & knee flexion in swing with cues. (Target Date: 5th visit)   Status New   PT  SHORT TERM GOAL #3   Title reports back pain reduced by 25% with activities. (Target Date: 5th visit)   Status New           PT Long Term Goals  - 02/14/15 1100    PT LONG TERM GOAL #1   Title demonstrates understanding of exercise program. (Target Date: 9th visit)   Status New   PT LONG TERM GOAL #2   Title Berg Balance >/= 50/56 (Target Date: 9th visit)   Status New   PT LONG TERM GOAL #3   Title Dynamic Gait Index >/= 16/24 (Target Date: 9th visit)   Status New   PT LONG TERM GOAL #4   Title negotiates ramps & curbs with prosthesis modified independent. (Target Date: 9th visit)   Status New   PT LONG TERM GOAL #5   Title ambulates 400' with prosthetic hip /knee flexion in swing and pelvic rotation modified independent. 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 Her residual limb is <1/3 length of left femur (short limb which causes increased difficulty with suspension and stance stability). Her current prosthesis limits her mobility significantly as the knee only has swing hydraulics. She would greatly benefit from a microprocessor prosthesis with knee hydraulics in swing & stance. Her gait velocity is functional level but gait deviations place her at risk of falls and she reports weekly falls. Deviations also are causing back & limb pain from 3/10 to 10/10. Her Berg Balance score of 45/56, Dynamic Gait Index 12/24 and Functional Gait Assesment 12/30 indicate fall risk. This PT who specializes in prosthetics feels her issues are related both movement patterns and not having stance hydraulics. Stance hydraulics mimic eccentric quadriceps actions in mobility which is why this would improve her stability.            Pt will benefit from skilled therapeutic intervention in order to improve on the following deficits Abnormal gait;Decreased balance;Decreased activity tolerance;Decreased mobility;Decreased range of motion   Rehab Potential Good   PT Frequency Other (comment)  8  visits    PT Duration Other (comment)  3 months   PT Treatment/Interventions ADLs/Self Care Home Management;DME Instruction;Gait training;Stair training;Functional mobility training;Therapeutic activities;Therapeutic exercise;Balance training;Neuromuscular re-education;Patient/family education   PT Next Visit Plan PNF pelvic moments, hip flexor stretch, core stability   Recommended Other Services Patient would benefit from new prosthesis with microprocessor knee and elevated vacuum system   Consulted and Agree with Plan of Care Patient         Problem List There are no active problems to display for this patient.   Carlynn Purl, DPT Physical Therapist Specializing in Prosthetics 02/14/2015, 10:12 PM  [facility] [address] [place], [place], [address] Phone: [phone]   Fax:  [phone]

## 2015-02-18 ENCOUNTER — Ambulatory Visit: Payer: BC Managed Care – PPO | Admitting: Physical Therapy

## 2015-02-18 DIAGNOSIS — R269 Unspecified abnormalities of gait and mobility: Secondary | ICD-10-CM

## 2015-02-18 DIAGNOSIS — M545 Low back pain: Secondary | ICD-10-CM

## 2015-02-18 DIAGNOSIS — M24651 Ankylosis, right hip: Secondary | ICD-10-CM

## 2015-02-18 NOTE — Patient Instructions (Signed)
BACK: Hip Flexor Stretch   Interlace fingers on top of right knee. Shift weight forward. Continue breathing normally and hold position for _3-5_ breaths.   _3-5__ times. Do _3-5__ times per day.  Copyright  VHI. All rights reserved.  Hip Flexors (Supine)   Lie with both legs bent over edge of firm surface. To stretch left hip, bring opposite knee to chest. Apply downward pressure to leg hanging over edge. Do not allow hips to roll up. Do not let knees change position. Hold __15-30__ seconds. Repeat _3-5___ times. Do __3-5__ sessions per day. CAUTION: Stretch should be gentle, steady and slow.  Copyright  VHI. All rights reserved.    CLOCK: hip is middle of clock, head is 12:00 1. On right side: move front of pelvis to 11:00 and sits bone on bottom of pelvis to 5:00 2. On left side: move front of pelvis to 1:00 and sits bone to 7:00 3. On left side: move arm to 1:00 reaching and elbow back to 7:00 4. On left side: move arm to 1:00, pelvis to 7:00, arm to 7:00 and pelvis to 1:00

## 2015-02-19 NOTE — Therapy (Signed)
[facility] [address] [place], [place], [address] Phone: [phone]   Fax:  [phone]  Physical Therapy Treatment  Patient Details  Name: Meredith Wu MRN: [id number] Date of Birth: 10-29-1961 Referring Provider:  Irish Lack., PA-C  Encounter Date: 02/18/2015      PT End of Session - 02/18/15 1015    Visit Number 2   Number of Visits 9   Date for PT Re-Evaluation 05/16/15   PT Start Time 6659   PT Stop Time 1100   PT Time Calculation (min) 45 min   Equipment Utilized During Treatment Gait belt   Activity Tolerance Patient tolerated treatment well   Behavior During Therapy [facility] for tasks assessed/performed      Past Medical History  Diagnosis Date  . 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 Crohns disease 10/1984  . H/O rosacea 2000  . H/O scoliosis 2001  . Hearing loss in left ear 2001  . Hx of ovarian cyst 2001  . Rectal fistula 2001  . Bartholin cyst 1998  . History of IBS 1997  . HX: breast cancer 2012  . High cholesterol   . History of rectal abscess 04/1989  . Kidney infection   . Blood transfusion   . H/O varicella   . Abnormal Pap smear 2010  . Herpes   . 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 Blood transfusion without reported diagnosis   . Osteoporosis     Past Surgical History  Procedure Laterality Date  . Bunion removal      Left   . Cesarean section    . Ovarian cyst removal  02/1997  . Mastectomy  05/07/11    Bilateral  . Breast reconstruction  01/03/12  . Bowel resection  08/2003  . Abdominal hysterectomy  2004  . Bartholin cyst drainage  1999  . Femur biopsy  1980  . 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 Above knee leg amputation  1980    right  . Rectal surgery  04/1989    abscess  . Ectopic pregnancy surgery    . Bartholin cyst marsupialization      drainage  . Salpingoophorectomy Right 02/1997  . Breast biopsy  [id number]  . Mastectomy  Bilateral   . Breast reduction surgery Bilateral [id number]  . Thyroidectomy Bilateral [id number]    There were no vitals filed for this visit.  Visit Diagnosis:  Low back pain without sciatica, unspecified back pain laterality  Decreased range of hip movement, right  Abnormality of gait      Subjective Assessment - 02/18/15 1015    Symptoms She reports she spoke with prosthetist and plans to see him to work on new socket. New liner she is trailing at night is not breaking her limb out as much as previous types except proximal lateral aspect.   Currently in Pain? Yes   Pain Score 5      Therapeutic Exercise: See patient education and patient instruction                          PT Education - 02/18/15 1015    Education provided Yes   Education Details hip flexor stretch in standing, sitting / modified kneel, supine Thomas position, Propriocetive Neuromuscular Facilitation at pelvis for anterior elevation and posterior depression. seated 4  point gait sequence   Person(s) Educated Patient   Methods Explanation;Demonstration;Tactile cues;Verbal cues;Handout  Comprehension Verbalized understanding;Returned demonstration;Verbal cues required;Tactile cues required;Need further instruction          PT Short Term Goals - 02/14/15 1100    PT SHORT TERM GOAL #1   Title demonstrates understanding of initial exercise program. (Target Date: 5th visit)   Status New   PT SHORT TERM GOAL #2   Title ambulates 400' with LRAD with hip & knee flexion in swing with cues. (Target Date: 5th visit)   Status New   PT SHORT TERM GOAL #3   Title reports back pain reduced by 25% with activities. (Target Date: 5th visit)   Status New           PT Long Term Goals - 02/14/15 1100    PT LONG TERM GOAL #1   Title demonstrates understanding of exercise program. (Target Date: 9th visit)   Status New   PT LONG TERM GOAL #2   Title Berg Balance >/= 50/56 (Target Date: 9th visit)    Status New   PT LONG TERM GOAL #3   Title Dynamic Gait Index >/= 16/24 (Target Date: 9th visit)   Status New   PT LONG TERM GOAL #4   Title negotiates ramps & curbs with prosthesis modified independent. (Target Date: 9th visit)   Status New   PT LONG TERM GOAL #5   Title ambulates 400' with prosthetic hip /knee flexion in swing and pelvic rotation modified independent. (Target Date: 9th visit)   Status New               Plan - 02/18/15 1015    Clinical Impression Statement She appears to understand hip flexor stretches and PNF pelvic motion. She understands sequencing 4 point seated exercise and plan to use for 4 point gait.   Pt will benefit from skilled therapeutic intervention in order to improve on the following deficits Abnormal gait;Decreased balance;Decreased activity tolerance;Decreased mobility;Decreased range of motion   Rehab Potential Good   PT Frequency Other (comment)  8 visits    PT Duration Other (comment)  3 months   PT Treatment/Interventions ADLs/Self Care Home Management;DME Instruction;Gait training;Stair training;Functional mobility training;Therapeutic activities;Therapeutic exercise;Balance training;Neuromuscular re-education;Patient/family education   PT Next Visit Plan PNF pelvic moments for pre-gait, 4 point forearm crutch gait   PT Home Exercise Plan core stability   Consulted and Agree with Plan of Care Patient        Problem List There are no active problems to display for this patient.   Faydra Korman PT, DPT 02/19/2015, 6:35 AM  [facility] [address] [place], [place], [address] Phone: [phone]   Fax:  [phone]

## 2015-03-06 ENCOUNTER — Encounter: Payer: BC Managed Care – PPO | Admitting: Physical Therapy

## 2015-03-12 ENCOUNTER — Ambulatory Visit: Payer: BC Managed Care – PPO | Admitting: Physical Therapy

## 2015-03-19 ENCOUNTER — Encounter: Payer: Self-pay | Admitting: Physical Therapy

## 2015-03-19 ENCOUNTER — Ambulatory Visit: Payer: BC Managed Care – PPO | Attending: Family Medicine | Admitting: Physical Therapy

## 2015-03-19 DIAGNOSIS — R269 Unspecified abnormalities of gait and mobility: Secondary | ICD-10-CM | POA: Insufficient documentation

## 2015-03-19 DIAGNOSIS — R29818 Other symptoms and signs involving the nervous system: Secondary | ICD-10-CM | POA: Diagnosis not present

## 2015-03-19 DIAGNOSIS — M545 Low back pain: Secondary | ICD-10-CM | POA: Insufficient documentation

## 2015-03-19 DIAGNOSIS — M24651 Ankylosis, right hip: Secondary | ICD-10-CM | POA: Diagnosis not present

## 2015-03-19 DIAGNOSIS — R2689 Other abnormalities of gait and mobility: Secondary | ICD-10-CM

## 2015-03-20 NOTE — Therapy (Signed)
[facility] [address] [place], [place], [address] Phone: [phone]   Fax:  [phone]  Physical Therapy Treatment  Patient Details  Name: Meredith Wu MRN: [id number] Date of Birth: 1961-06-19 Referring Provider:  Irish Lack., PA-C  Encounter Date: 03/19/2015      PT End of Session - 03/19/15 0800    Visit Number 3   Number of Visits 9   Date for PT Re-Evaluation 05/16/15   PT Start Time 0805   PT Stop Time 0845   PT Time Calculation (min) 40 min   Equipment Utilized During Treatment Gait belt   Activity Tolerance Patient tolerated treatment well   Behavior During Therapy [facility] for tasks assessed/performed      Past Medical History  Diagnosis Date  . 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 Crohns disease 10/1984  . H/O rosacea 2000  . H/O scoliosis 2001  . Hearing loss in left ear 2001  . Hx of ovarian cyst 2001  . Rectal fistula 2001  . Bartholin cyst 1998  . History of IBS 1997  . HX: breast cancer 2012  . High cholesterol   . History of rectal abscess 04/1989  . Kidney infection   . Blood transfusion   . H/O varicella   . Abnormal Pap smear 2010  . Herpes   . 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 Blood transfusion without reported diagnosis   . Osteoporosis     Past Surgical History  Procedure Laterality Date  . Bunion removal      Left   . Cesarean section    . Ovarian cyst removal  02/1997  . Mastectomy  05/07/11    Bilateral  . Breast reconstruction  01/03/12  . Bowel resection  08/2003  . Abdominal hysterectomy  2004  . Bartholin cyst drainage  1999  . Femur biopsy  1980  . 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 Above knee leg amputation  1980    right  . Rectal surgery  04/1989    abscess  . Ectopic pregnancy surgery    . Bartholin cyst marsupialization      drainage  . Salpingoophorectomy Right 02/1997  . Breast biopsy  [id number]  . Mastectomy  Bilateral   . Breast reduction surgery Bilateral [id number]  . Thyroidectomy Bilateral [id number]    There were no vitals filed for this visit.  Visit Diagnosis:  Low back pain without sciatica, unspecified back pain laterality  Decreased range of hip movement, right  Abnormality of gait  Balance problems      Subjective Assessment - 03/19/15 0800    Subjective Reports doing HEP but clock exercise is a little confusing so not sure she is doing it correctly.   Currently in Pain? Yes   Pain Score 5    Pain Location Back   Pain Descriptors / Indicators Aching;Stabbing   Pain Type Chronic pain   Pain Onset More than a month ago   Pain Frequency Constant   Multiple Pain Sites Yes   Pain Score 6   Pain Location Groin   Pain Orientation Right   Pain Descriptors / Indicators Pressure;Sharp   Pain Onset More than a month ago   Pain Frequency Constant      Neuromuscular Re-education: Proprioceptive Neuromuscular (PNF) with manual /tactile and verbal cues: Anterior elevation and posterior depression of pelvis Sidelying pelvis and shoulder movements including combining, PT cueing PNF movements and  gait movements Sitting pelvic PNF movements to scoot forward and backward Standing at counter support with chairback support also with mirror for visual feedback: stance wt shift over prosthesis then PNF posterior depression in stance and swing wt shift /pelvic motion to unlock prosthesis in terminal stance followed by PNF anterior elevation to advance prosthesis.  Patient ambulated to waiting area with PT cueing to slow down and incorporate pre-gait into gait.                       PT Education - 03/19/15 0800    Education provided Yes   Education Details PNF sidelying, sitting & standing with UE support   Person(s) Educated Patient   Methods Explanation;Demonstration;Tactile cues;Verbal cues   Comprehension Verbalized understanding;Returned demonstration;Verbal cues  required;Tactile cues required;Need further instruction          PT Short Term Goals - 02/14/15 1100    PT SHORT TERM GOAL #1   Title demonstrates understanding of initial exercise program. (Target Date: 5th visit)   Status New   PT SHORT TERM GOAL #2   Title ambulates 400' with LRAD with hip & knee flexion in swing with cues. (Target Date: 5th visit)   Status New   PT SHORT TERM GOAL #3   Title reports back pain reduced by 25% with activities. (Target Date: 5th visit)   Status New           PT Long Term Goals - 02/14/15 1100    PT LONG TERM GOAL #1   Title demonstrates understanding of exercise program. (Target Date: 9th visit)   Status New   PT LONG TERM GOAL #2   Title Berg Balance >/= 50/56 (Target Date: 9th visit)   Status New   PT LONG TERM GOAL #3   Title Dynamic Gait Index >/= 16/24 (Target Date: 9th visit)   Status New   PT LONG TERM GOAL #4   Title negotiates ramps & curbs with prosthesis modified independent. (Target Date: 9th visit)   Status New   PT LONG TERM GOAL #5   Title ambulates 400' with prosthetic hip /knee flexion in swing and pelvic rotation modified independent. (Target Date: 9th visit)   Status New               Plan - 03/19/15 0800    Clinical Impression Statement Patient appears to understand PNF exercises and beginning to incorporate into pre-gait in standing at counter.    Pt will benefit from skilled therapeutic intervention in order to improve on the following deficits Abnormal gait;Decreased balance;Decreased activity tolerance;Decreased mobility;Decreased range of motion   Rehab Potential Good   PT Frequency Other (comment)  8 visits    PT Duration Other (comment)  3 months   PT Treatment/Interventions ADLs/Self Care Home Management;DME Instruction;Gait training;Stair training;Functional mobility training;Therapeutic activities;Therapeutic exercise;Balance training;Neuromuscular re-education;Patient/family education   PT Next  Visit Plan check PNF pelvic moments for pre-gait, 4 point forearm crutch gait   PT Home Exercise Plan core stability   Consulted and Agree with Plan of Care Patient        Problem List There are no active problems to display for this patient.   Shirlene Andaya PT, DPT 03/20/2015, 8:03 AM  [facility] [address] [place], [place], [address] Phone: [phone]   Fax:  [phone]

## 2015-03-26 ENCOUNTER — Ambulatory Visit: Payer: BC Managed Care – PPO | Admitting: Physical Therapy

## 2015-03-26 ENCOUNTER — Encounter: Payer: Self-pay | Admitting: Physical Therapy

## 2015-03-26 DIAGNOSIS — R269 Unspecified abnormalities of gait and mobility: Secondary | ICD-10-CM

## 2015-03-26 DIAGNOSIS — M545 Low back pain: Secondary | ICD-10-CM

## 2015-03-26 DIAGNOSIS — R2689 Other abnormalities of gait and mobility: Secondary | ICD-10-CM

## 2015-03-26 DIAGNOSIS — M24651 Ankylosis, right hip: Secondary | ICD-10-CM

## 2015-03-26 NOTE — Therapy (Signed)
[facility] [address] [place], [place], [address] Phone: [phone]   Fax:  [phone]  Physical Therapy Treatment  Patient Details  Name: Meredith Wu MRN: [id number] Date of Birth: [birth year] Referring Provider:  Irish Lack., PA-C  Encounter Date: 03/26/2015      PT End of Session - 03/26/15 1400    Visit Number 4   Number of Visits 9   Date for PT Re-Evaluation 05/16/15   PT Start Time 1404   PT Stop Time 1500   PT Time Calculation (min) 56 min   Equipment Utilized During Treatment Gait belt   Activity Tolerance Patient tolerated treatment well   Behavior During Therapy [facility] for tasks assessed/performed      Past Medical History  Diagnosis Date  . 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 Crohns disease 10/1984  . H/O rosacea 2000  . H/O scoliosis 2001  . Hearing loss in left ear 2001  . Hx of ovarian cyst 2001  . Rectal fistula 2001  . Bartholin cyst 1998  . History of IBS 1997  . HX: breast cancer 2012  . High cholesterol   . History of rectal abscess 04/1989  . Kidney infection   . Blood transfusion   . H/O varicella   . Abnormal Pap smear 2010  . Herpes   . 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 Blood transfusion without reported diagnosis   . Osteoporosis     Past Surgical History  Procedure Laterality Date  . Bunion removal      Left   . Cesarean section    . Ovarian cyst removal  02/1997  . Mastectomy  05/07/11    Bilateral  . Breast reconstruction  01/03/12  . Bowel resection  08/2003  . Abdominal hysterectomy  2004  . Bartholin cyst drainage  1999  . Femur biopsy  1980  . 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 Above knee leg amputation  1980    right  . Rectal surgery  04/1989    abscess  . Ectopic pregnancy surgery    . Bartholin cyst marsupialization      drainage  . Salpingoophorectomy Right 02/1997  . Breast biopsy  [id number]  . Mastectomy  Bilateral   . Breast reduction surgery Bilateral [id number]  . Thyroidectomy Bilateral [id number]    There were no vitals filed for this visit.  Visit Diagnosis:  Low back pain without sciatica, unspecified back pain laterality  Decreased range of hip movement, right  Abnormality of gait  Balance problems      Subjective Assessment - 03/26/15 1506    Subjective worked on [employer] and got better with pelvic movements but the sitting one is still "wierd"   Currently in Pain? Yes   Pain Score 5    Pain Location Back   Pain Descriptors / Indicators Aching;Stabbing   Pain Type Chronic pain   Pain Onset More than a month ago   Pain Frequency Constant   Multiple Pain Sites Yes   Pain Score 6   Pain Location Groin   Pain Orientation Right   Pain Descriptors / Indicators Pressure;Sharp   Pain Onset More than a month ago   Pain Frequency Constant     Prosthetic Training: Scooting forward and back in chair with PNF pattern with tactile & verbal cues on posterior depression and anterior elevation on right & left sides. PT instructed in proper crutch &  cane adjustment with demo. Patient verbalized understanding. 4-point to 2-point gait to facilitate improved pelvic motion, movement patterns, 1000' with tactile  / verbal cues on sequence and step length, prosthetic knee flexion to initiate swing. PT demo / instructed in negotiating obstacles / turning with proper step length and pelvic motion: Patient return demo with verbal cues. Stairs: PT demo using PNF pelvic posterior depression in stance to facilitate stability in stance - Patient return demo with 2 rails with cues. Ramp with crutches / 2 point pattern: PT demo / instructed in movement pattern, posture, and wt shift; ascend with cues on knee flexion and wt shift; descend with progressive activities to facilitate proper wt shift over prosthesis - best method was slow 4 point gait pattern.                            PT  Education - 03/26/15 1400    Education provided Yes   Education Details reviewed PNF sitting, 4 point to 2 point gait with forearm crutches, benefits of using crutches to improve balance to allow proper movement patterns with gait, negotiating obstacles, stairs, ramp   Person(s) Educated Patient   Methods Explanation;Demonstration;Tactile cues;Verbal cues   Comprehension Verbalized understanding;Returned demonstration;Verbal cues required;Tactile cues required;Need further instruction          PT Short Term Goals - 02/14/15 1100    PT SHORT TERM GOAL #1   Title demonstrates understanding of initial exercise program. (Target Date: 5th visit)   Status New   PT SHORT TERM GOAL #2   Title ambulates 400' with LRAD with hip & knee flexion in swing with cues. (Target Date: 5th visit)   Status New   PT SHORT TERM GOAL #3   Title reports back pain reduced by 25% with activities. (Target Date: 5th visit)   Status New           PT Long Term Goals - 02/14/15 1100    PT LONG TERM GOAL #1   Title demonstrates understanding of exercise program. (Target Date: 9th visit)   Status New   PT LONG TERM GOAL #2   Title Berg Balance >/= 50/56 (Target Date: 9th visit)   Status New   PT LONG TERM GOAL #3   Title Dynamic Gait Index >/= 16/24 (Target Date: 9th visit)   Status New   PT LONG TERM GOAL #4   Title negotiates ramps & curbs with prosthesis modified independent. (Target Date: 9th visit)   Status New   PT LONG TERM GOAL #5   Title ambulates 400' with prosthetic hip /knee flexion in swing and pelvic rotation modified independent. (Target Date: 9th visit)   Status New               Plan - 03/26/15 1400    Clinical Impression Statement Patient improved gait with use of crutches to allow her to focus on movement patterns. Patient improved ability to negotiate obstacles, stairs and ramp.   Pt will benefit from skilled therapeutic intervention in order to improve on the following  deficits Abnormal gait;Decreased balance;Decreased activity tolerance;Decreased mobility;Decreased range of motion   Rehab Potential Good   PT Frequency Other (comment)  8 visits    PT Duration Other (comment)  3 months   PT Treatment/Interventions ADLs/Self Care Home Management;DME Instruction;Gait training;Stair training;Functional mobility training;Therapeutic activities;Therapeutic exercise;Balance training;Neuromuscular re-education;Patient/family education   PT Next Visit Plan assess STGs, check PNF pelvic moments for pre-gait, 4 point  forearm crutch gait   PT Home Exercise Plan core stability   Consulted and Agree with Plan of Care Patient        Problem List There are no active problems to display for this patient.   Jamey Reas PT, DPT 03/26/2015, 8:25 PM  [facility] [address] [place], [place], [address] Phone: [phone]   Fax:  [phone]

## 2015-04-01 ENCOUNTER — Ambulatory Visit: Payer: BC Managed Care – PPO | Admitting: Physical Therapy

## 2015-04-01 ENCOUNTER — Encounter: Payer: Self-pay | Admitting: Physical Therapy

## 2015-04-01 DIAGNOSIS — R2689 Other abnormalities of gait and mobility: Secondary | ICD-10-CM

## 2015-04-01 DIAGNOSIS — M24651 Ankylosis, right hip: Secondary | ICD-10-CM

## 2015-04-01 DIAGNOSIS — M545 Low back pain: Secondary | ICD-10-CM

## 2015-04-01 DIAGNOSIS — R269 Unspecified abnormalities of gait and mobility: Secondary | ICD-10-CM | POA: Diagnosis not present

## 2015-04-01 NOTE — Therapy (Signed)
[facility] [address] [place], [place], [address] Phone: [phone]   Fax:  [phone]  Physical Therapy Treatment  Patient Details  Name: Meredith Wu MRN: [id number] Date of Birth: [birth year] Referring Provider:  Irish Lack., PA-C  Encounter Date: 04/01/2015      PT End of Session - 04/01/15 1230    Visit Number 5   Number of Visits 9   Date for PT Re-Evaluation 05/16/15   PT Start Time 1230   PT Stop Time 1315   PT Time Calculation (min) 45 min   Equipment Utilized During Treatment Gait belt   Activity Tolerance Patient tolerated treatment well   Behavior During Therapy [facility] for tasks assessed/performed      Past Medical History  Diagnosis Date  . 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 Crohns disease 10/1984  . H/O rosacea 2000  . H/O scoliosis 2001  . Hearing loss in left ear 2001  . Hx of ovarian cyst 2001  . Rectal fistula 2001  . Bartholin cyst 1998  . History of IBS 1997  . HX: breast cancer 2012  . High cholesterol   . History of rectal abscess 04/1989  . Kidney infection   . Blood transfusion   . H/O varicella   . Abnormal Pap smear 2010  . Herpes   . 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 Blood transfusion without reported diagnosis   . Osteoporosis     Past Surgical History  Procedure Laterality Date  . Bunion removal      Left   . Cesarean section    . Ovarian cyst removal  02/1997  . Mastectomy  05/07/11    Bilateral  . Breast reconstruction  01/03/12  . Bowel resection  08/2003  . Abdominal hysterectomy  2004  . Bartholin cyst drainage  1999  . Femur biopsy  1980  . 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 Above knee leg amputation  1980    right  . Rectal surgery  04/1989    abscess  . Ectopic pregnancy surgery    . Bartholin cyst marsupialization      drainage  . Salpingoophorectomy Right 02/1997  . Breast biopsy  [id number]  . Mastectomy  Bilateral   . Breast reduction surgery Bilateral [id number]  . Thyroidectomy Bilateral [id number]    There were no vitals filed for this visit.  Visit Diagnosis:  Low back pain without sciatica, unspecified back pain laterality  Decreased range of hip movement, right  Abnormality of gait  Balance problems      Subjective Assessment - 04/01/15 1236    Subjective (p) do exercises as PT instructed no falls.   Currently in Pain? (p) Yes   Pain Score (p) 3    Pain Location (p) Back   Pain Type (p) Chronic pain   Pain Score (p) 4   Pain Location (p) Groin   Pain Orientation (p) Right   Pain Descriptors / Indicators (p) Pressure;Sharp   Pain Onset (p) More than a month ago   Pain Frequency (p) Constant     Prosthetic Training: PT adjusted patient's forearm crutches. PT instructed in use of folded towel to level pelvis in sitting with demo on patient. Patient verbalized understanding and reports could tell a difference. Patient ambulated with 2-point pattern with forearm crutches with cues to flex knee more. Stairs with verbal, tactile and visual cues working on stance on prosthesis  with proper wt shift and pelvic posterior depression. Gait in parallel bars with mirrors and verbal cues. PT cued on step width, knee flexion in terminal stance and posture / wt shift. Patient ambulated with forearm crutches with tactile / verbal cues to carryover above.                             PT Education - 04/01/15 1230    Education provided Yes   Education Details stairs with proper wt shift, gait with step width (not abducted), leveling pelvis in sitting to improve back pain   Person(s) Educated Patient   Methods Explanation;Demonstration   Comprehension Verbalized understanding;Returned demonstration;Need further instruction          PT Short Term Goals - 04/01/15 1230    PT SHORT TERM GOAL #1   Title demonstrates understanding of initial exercise program. (Target  Date: 5th visit)   Baseline MET 04/01/15   Status Achieved   PT SHORT TERM GOAL #2   Title ambulates 400' with LRAD with hip & knee flexion in swing with cues. (Target Date: 5th visit)   Baseline MET with forearm crutches 04/01/15   Status Achieved   PT SHORT TERM GOAL #3   Title reports back pain reduced by 25% with activities. (Target Date: 5th visit)   Baseline MET 04/01/15 Back pain reduced from 5/10 to 3/10   Status New           PT Long Term Goals - 02/14/15 1100    PT LONG TERM GOAL #1   Title demonstrates understanding of exercise program. (Target Date: 9th visit)   Status New   PT LONG TERM GOAL #2   Title Berg Balance >/= 50/56 (Target Date: 9th visit)   Status New   PT LONG TERM GOAL #3   Title Dynamic Gait Index >/= 16/24 (Target Date: 9th visit)   Status New   PT LONG TERM GOAL #4   Title negotiates ramps & curbs with prosthesis modified independent. (Target Date: 9th visit)   Status New   PT LONG TERM GOAL #5   Title ambulates 400' with prosthetic hip /knee flexion in swing and pelvic rotation modified independent. (Target Date: 9th visit)   Status New               Plan - 04/01/15 1230    Clinical Impression Statement Patient met all STGs. She has decreased back pain with activities. Patient would greatly benefit from a microprocessor knee to improve function and safety.   Pt will benefit from skilled therapeutic intervention in order to improve on the following deficits Abnormal gait;Decreased balance;Decreased activity tolerance;Decreased mobility;Decreased range of motion   Rehab Potential Good   PT Frequency Other (comment)  8 visits    PT Duration Other (comment)  3 months   PT Treatment/Interventions ADLs/Self Care Home Management;DME Instruction;Gait training;Stair training;Functional mobility training;Therapeutic activities;Therapeutic exercise;Balance training;Neuromuscular re-education;Patient/family education   PT Next Visit Plan 4 point  forearm crutch gait   PT Home Exercise Plan core stability   Consulted and Agree with Plan of Care Patient        Problem List There are no active problems to display for this patient.   Jamey Reas PT, DPT 04/02/2015, 5:21 PM  [facility] [address] [place], [place], [address] Phone: [phone]   Fax:  [phone]

## 2015-04-02 ENCOUNTER — Other Ambulatory Visit: Payer: Self-pay | Admitting: Dermatology

## 2015-04-09 ENCOUNTER — Encounter: Payer: BC Managed Care – PPO | Admitting: Physical Therapy

## 2015-04-16 ENCOUNTER — Encounter: Payer: BC Managed Care – PPO | Admitting: Physical Therapy

## 2015-04-23 ENCOUNTER — Encounter: Payer: BC Managed Care – PPO | Admitting: Physical Therapy

## 2015-09-01 ENCOUNTER — Encounter: Payer: Self-pay | Admitting: Physical Therapy

## 2015-09-01 NOTE — Therapy (Signed)
[facility] [address] [place], [place], [address] Phone: [phone]   Fax:  [phone]  Patient Details  Name: Meredith Wu MRN: [id number] Date of Birth: 08-01-1961 Referring Abdiaziz Klahn:  No ref. Trayden Brandy found  Encounter Date: 09/01/2015   PHYSICAL THERAPY DISCHARGE SUMMARY  Visits from Start of Care: 5  Current functional level related to goals / functional outcomes: Patient did not return for further PT so status at time of discharge was not known.     PT Short Term Goals - 04/01/15 1230    PT SHORT TERM GOAL #1   Title demonstrates understanding of initial exercise program. (Target Date: 5th visit)   Baseline MET 04/01/15   Status Achieved   PT SHORT TERM GOAL #2   Title ambulates 400' with LRAD with hip & knee flexion in swing with cues. (Target Date: 5th visit)   Baseline MET with forearm crutches 04/01/15   Status Achieved   PT SHORT TERM GOAL #3   Title reports back pain reduced by 25% with activities. (Target Date: 5th visit)   Baseline MET 04/01/15 Back pain reduced from 5/10 to 3/10   Status Achieved         PT Long Term Goals - 02/14/15 1100    PT LONG TERM GOAL #1   Title demonstrates understanding of exercise program. (Target Date: 9th visit)   Status New   PT LONG TERM GOAL #2   Title Berg Balance >/= 50/56 (Target Date: 9th visit)   Status New   PT LONG TERM GOAL #3   Title Dynamic Gait Index >/= 16/24 (Target Date: 9th visit)   Status New   PT LONG TERM GOAL #4   Title negotiates ramps & curbs with prosthesis modified independent. (Target Date: 9th visit)   Status New   PT LONG TERM GOAL #5   Title ambulates 400' with prosthetic hip /knee flexion in swing and pelvic rotation modified independent. (Target Date: 9th visit)   Status New       Remaining deficits: See above   Education / Equipment: Patient was instructed in updated prosthesis use.  Plan: Patient was not present to  agree or disagree with discharge. Patient goals were not met. Patient is being discharged due to not returning since the last visit.  ?????       WALDRON,ROBIN  PT, DPT  09/01/2015, 8:54 AM  [facility] [address] [place], [place], [address] Phone: [phone]   Fax:  [phone]

## 2015-11-06 ENCOUNTER — Other Ambulatory Visit: Payer: Self-pay | Admitting: General Surgery

## 2022-02-10 ENCOUNTER — Encounter: Payer: Self-pay | Admitting: Orthopedic Surgery

## 2023-06-12 ENCOUNTER — Emergency Department (HOSPITAL_BASED_OUTPATIENT_CLINIC_OR_DEPARTMENT_OTHER): Payer: BC Managed Care – PPO

## 2023-06-12 ENCOUNTER — Encounter (HOSPITAL_BASED_OUTPATIENT_CLINIC_OR_DEPARTMENT_OTHER): Payer: Self-pay

## 2023-06-12 ENCOUNTER — Other Ambulatory Visit: Payer: Self-pay

## 2023-06-12 ENCOUNTER — Emergency Department (HOSPITAL_BASED_OUTPATIENT_CLINIC_OR_DEPARTMENT_OTHER)
Admission: EM | Admit: 2023-06-12 | Discharge: 2023-06-12 | Disposition: A | Discharge status: Home / Self Care | Payer: BC Managed Care – PPO | Attending: Emergency Medicine | Admitting: Emergency Medicine

## 2023-06-12 DIAGNOSIS — M791 Myalgia, unspecified site: Secondary | ICD-10-CM | POA: Diagnosis not present

## 2023-06-12 DIAGNOSIS — Z79899 Other long term (current) drug therapy: Secondary | ICD-10-CM | POA: Diagnosis not present

## 2023-06-12 DIAGNOSIS — I10 Essential (primary) hypertension: Secondary | ICD-10-CM | POA: Diagnosis not present

## 2023-06-12 DIAGNOSIS — M546 Pain in thoracic spine: Secondary | ICD-10-CM | POA: Diagnosis not present

## 2023-06-12 DIAGNOSIS — K5090 Crohn's disease, unspecified, without complications: Secondary | ICD-10-CM | POA: Diagnosis not present

## 2023-06-12 DIAGNOSIS — R079 Chest pain, unspecified: Secondary | ICD-10-CM | POA: Diagnosis not present

## 2023-06-12 DIAGNOSIS — M25512 Pain in left shoulder: Secondary | ICD-10-CM | POA: Insufficient documentation

## 2023-06-12 DIAGNOSIS — M7918 Myalgia, other site: Secondary | ICD-10-CM

## 2023-06-12 HISTORY — DX: Unspecified asthma, uncomplicated: J45.909

## 2023-06-12 HISTORY — DX: Disorder of thyroid, unspecified: E07.9

## 2023-06-12 HISTORY — DX: Essential (primary) hypertension: I10

## 2023-06-12 LAB — BASIC METABOLIC PANEL
Anion gap: 10 (ref 5–15)
BUN: 14 mg/dL (ref 6–20)
CO2: 20 mmol/L — ABNORMAL LOW (ref 22–32)
Calcium: 8.9 mg/dL (ref 8.9–10.3)
Chloride: 107 mmol/L (ref 98–111)
Creatinine, Ser: 0.46 mg/dL (ref 0.44–1.00)
GFR, Estimated: >60 mL/min (ref >60)
Glucose, Bld: 149 mg/dL — ABNORMAL HIGH (ref 70–99)
Potassium: 3.4 mmol/L — ABNORMAL LOW (ref 3.5–5.1)
Sodium: 137 mmol/L (ref 135–145)

## 2023-06-12 LAB — CBC
HCT: 35.6 % — ABNORMAL LOW (ref 36.0–46.0)
Hemoglobin: 12.2 g/dL (ref 12.0–15.0)
MCH: 31.7 pg (ref 26.0–34.0)
MCHC: 34.3 g/dL (ref 30.0–36.0)
MCV: 92.5 fL (ref 80.0–100.0)
Platelets: 275 10*3/uL (ref 150–400)
RBC: 3.85 MIL/uL — ABNORMAL LOW (ref 3.87–5.11)
RDW: 12.7 % (ref 11.5–15.5)
WBC: 6.2 10*3/uL (ref 4.0–10.5)
nRBC: 0 % (ref 0.0–0.2)

## 2023-06-12 LAB — TROPONIN I (HIGH SENSITIVITY)
Troponin I (High Sensitivity): 3 ng/L (ref <18)
Troponin I (High Sensitivity): 4 ng/L (ref <18)

## 2023-06-12 LAB — D-DIMER, QUANTITATIVE: D-Dimer, Quant: 0.39 ug/mL-FEU (ref 0.00–0.50)

## 2023-06-12 MED ORDER — OXYCODONE-ACETAMINOPHEN 5-325 MG PO TABS
1.0000 | ORAL_TABLET | Freq: Once | ORAL | Status: AC
  Administered 2023-06-12: 1 via ORAL
  Filled 2023-06-12: qty 1

## 2023-06-12 MED ORDER — OXYCODONE HCL 5 MG PO TABS: 5.0000 mg | ORAL_TABLET | Freq: Four times a day (QID) | ORAL | 0 refills | Status: AC | PRN

## 2023-06-12 MED ORDER — KETOROLAC TROMETHAMINE 30 MG/ML IJ SOLN
30.0000 mg | Freq: Once | INTRAMUSCULAR | Status: AC
  Administered 2023-06-12: 30 mg via INTRAMUSCULAR
  Filled 2023-06-12: qty 1

## 2023-06-12 NOTE — Discharge Instructions (Signed)
Rest, light stretching, Motrin or Tylenol for mild pain.  Narcotics sent to pharmacy for severe pain.

## 2023-06-12 NOTE — ED Notes (Signed)
ED Provider at bedside. 

## 2023-06-12 NOTE — ED Provider Notes (Signed)
[facility] Provider Note   CSN: [id number] Arrival date & time: 06/12/23  1027     History  Chief Complaint  Patient presents with   Back Pain   Chest Pain    Meredith Wu is a 62 y.o. female.  Patient is a 62 yo female with pmh of osteogenic sarcoma, HTN, hyperlipidemia, DB, and Crohn's Disease presenting for complaints of chest pain. Pt admits to left shoulder blade pain that radiates to the upper back and left chest. Admits to some sob earlier but none currently. Pain started while reaching upward to grab something out of a cupboard. Also has a hx of a labrium tear in the left shoulder and has been recently doing shoulder focused exercises. Denies hx of DVT or PEs. Denies recent falls or injuries. Denies hx of MI.  The history is provided by the patient. No language interpreter was used.  Back Pain Associated symptoms: chest pain   Associated symptoms: no abdominal pain, no dysuria and no fever   Chest Pain Associated symptoms: back pain   Associated symptoms: no abdominal pain, no cough, no fever, no palpitations, no shortness of breath and no vomiting        Home Medications Prior to Admission medications   Medication Sig Start Date End Date Taking? Authorizing Provider  B Complex Vitamins (VITAMIN B COMPLEX) TABS Take by mouth.    [provider]  bimatoprost (LATISSE) 0.03 % ophthalmic solution Place into both eyes at bedtime. Place one drop on applicator and apply evenly along the skin of the upper eyelid at base of eyelashes once daily at bedtime; repeat procedure for second eye (use a clean applicator).    [provider]  Calcium Carb-Cholecalciferol (CALCIUM CARBONATE-VITAMIN D3) 600-400 MG-UNIT TABS Take by mouth.    [provider]  colesevelam (WELCHOL) 625 MG tablet Take 1,875 mg by mouth 2 (two) times daily with a meal.    [provider]  ergocalciferol (VITAMIN D2) 50000 UNITS  capsule Take 50,000 Units by mouth once a week.    [provider]  fenofibrate 160 MG tablet  06/12/13   [provider]  fish oil-omega-3 fatty acids 1000 MG capsule Take 2 g by mouth daily.    [provider]  fluticasone Aleda Grana) 50 MCG/ACT nasal spray  05/16/13   [provider]  folic acid (FOLVITE) 400 MCG tablet Take 400 mcg by mouth daily.    [provider]  gabapentin (NEURONTIN) 100 MG capsule Take 100 mg by mouth 3 (three) times daily.    [provider]  lisinopril (PRINIVIL,ZESTRIL) 10 MG tablet Take 10 mg by mouth daily.    [provider]  Lysine 500 MG TABS Take by mouth.    [provider]  mercaptopurine (PURINETHOL) 50 MG tablet  05/20/13   [provider]  Multiple Vitamins-Minerals (MULTIVITAMIN PO) Take by mouth.    [provider]  SYNTHROID 175 MCG tablet  06/03/13   [provider]  tamoxifen (NOLVADEX) 20 MG tablet  03/22/13   [provider]  vitamin E (VITAMIN E) 400 UNIT capsule Take 400 Units by mouth daily.    [provider]      Allergies    Claritin [loratadine], Talwin [pentazocine], and Torecan [thiethylperazine]    Review of Systems   Review of Systems  Constitutional:  Negative for chills and fever.  HENT:  Negative for ear pain and sore throat.   Eyes:  Negative for pain and visual disturbance.  Respiratory:  Negative for cough and shortness of breath.   Cardiovascular:  Positive for chest pain. Negative for palpitations.  Gastrointestinal:  Negative for abdominal pain and vomiting.  Genitourinary:  Negative for dysuria and hematuria.  Musculoskeletal:  Positive for back pain. Negative for arthralgias.  Skin:  Negative for color change and rash.  Neurological:  Negative for seizures and syncope.  All other systems reviewed and are negative.   Physical Exam Updated Vital Signs BP (!) 163/76   Pulse 93   Temp 97.9 F (36.6 C)  (Oral)   Resp 18   Ht 5\' 4"  (1.626 m)   Wt 85.3 kg   SpO2 97%   BMI 32.27 kg/m  Physical Exam Vitals and nursing note reviewed.  Constitutional:      General: She is not in acute distress.    Appearance: She is well-developed.  HENT:     Head: Normocephalic and atraumatic.  Eyes:     Conjunctiva/sclera: Conjunctivae normal.  Cardiovascular:     Rate and Rhythm: Normal rate and regular rhythm.     Heart sounds: No murmur heard. Pulmonary:     Effort: Pulmonary effort is normal. No respiratory distress.     Breath sounds: Normal breath sounds.  Abdominal:     Palpations: Abdomen is soft.     Tenderness: There is no abdominal tenderness.  Musculoskeletal:        General: No swelling.     Cervical back: Neck supple.  Skin:    General: Skin is warm and dry.     Capillary Refill: Capillary refill takes less than 2 seconds.  Neurological:     Mental Status: She is alert.  Psychiatric:        Mood and Affect: Mood normal.     ED Results / Procedures / Treatments   Labs (all labs ordered are listed, but only abnormal results are displayed) Labs Reviewed  BASIC METABOLIC PANEL - Abnormal; Notable for the following components:      Result Value   Potassium 3.4 (*)    CO2 20 (*)    Glucose, Bld 149 (*)    All other components within normal limits  CBC - Abnormal; Notable for the following components:   RBC 3.85 (*)    HCT 35.6 (*)    All other components within normal limits  D-DIMER, QUANTITATIVE  TROPONIN I (HIGH SENSITIVITY)  TROPONIN I (HIGH SENSITIVITY)    EKG EKG Interpretation Date/Time:  Sunday June 12 2023 10:42:36 EDT Ventricular Rate:  89 PR Interval:  166 QRS Duration:  100 QT Interval:  382 QTC Calculation: 465 R Axis:   74  Text Interpretation: Sinus rhythm Borderline T abnormalities, inferior leads Confirmed by Edwin Dada ([id number]) on 06/12/2023 2:12:45 PM  Radiology No results found.  Procedures Procedures    Medications Ordered in  ED Medications  ketorolac (TORADOL) 30 MG/ML injection 30 mg (30 mg Intramuscular Given 06/12/23 1451)  oxyCODONE-acetaminophen (PERCOCET/ROXICET) 5-325 MG per tablet 1 tablet (1 tablet Oral Given 06/12/23 1450)    ED Course/ Medical Decision Making/ A&P                             Medical Decision Making Amount and/or Complexity of Data Reviewed Labs: ordered. Radiology: ordered.  Risk Prescription drug management.   8:47 AM  62 yo female with pmh of osteogenic sarcoma, HTN, hyperlipidemia, DB, and Crohn's Disease presenting  for complaints of chest pain.  Patient is alert and oriented x 3, no acute distress, afebrile, stable vital signs. Symptoms likely musculoskeletal in nature. Pain is reproducable on exam. Will rule out cardiac and pulmonary etiology.   The patient's chest pain is not suggestive of pulmonary embolus, cardiac ischemia, aortic dissection, pericarditis, myocarditis, pulmonary embolism, pneumothorax, pneumonia, Zoster, or esophageal perforation, or other serious etiology.  Historically not abrupt in onset, tearing or ripping, pulses symmetric. EKG nonspecific for ischemia/infarction. No dysrhythmias, brugada, WPW, prolonged QT noted. CXR reviewed and WNL. Troponin negative. CXR reviewed. D-dimer wnl. Low suspicion PE. Labs without demonstration of acute pathology unless otherwise noted above.   Low HEART Score: 0-3 points (0.9-1.7% risk of MACE).] Given the extremely low risk of these diagnoses further testing and evaluation for these possibilities does not appear to be indicated at this time.    Patient in no distress and overall condition improved here in the ED. Detailed discussions were had with the patient regarding current findings, and need for close f/u with PCP or on call doctor. The patient has been instructed to return immediately if the symptoms worsen in any way for re-evaluation. Patient verbalized understanding and is in agreement with current care plan. All  questions answered prior to discharge.         Final Clinical Impression(s) / ED Diagnoses Final diagnoses:  Acute pain of left shoulder  Acute left-sided thoracic back pain  Chest pain, unspecified type  Musculoskeletal pain    Rx / DC Orders ED Discharge Orders          Ordered    oxyCODONE (ROXICODONE) 5 MG immediate release tablet  Every 6 hours PRN        06/12/23 1527              Franne Forts, DO 06/18/23 [phone]

## 2023-06-12 NOTE — ED Triage Notes (Signed)
Patient noticed she was having pain around her left shoulder blade yesterday. Now it is wrapping around the left side of her chest and is having shortness of breath. She denied cough or fever.

## 2023-06-18 ENCOUNTER — Encounter (HOSPITAL_BASED_OUTPATIENT_CLINIC_OR_DEPARTMENT_OTHER): Payer: Self-pay | Admitting: Emergency Medicine

## 2024-06-22 NOTE — Progress Notes (Signed)
 Subjective Patient ID: Meredith Wu is a 63 y.o. female.    Patient reports dysuria for 3-4 days. States she was treated for a UTI 06/13/2024 and it did help for a few days. States now she is having subjective fever, back pain, low volume and frequency. Denies vaginal bleeding or discharge.    UTI Associated symptoms: abdominal pain and fever   Associated symptoms: no fatigue, no myalgias, no nausea, no rash and no vomiting     Review of Systems  Constitutional:  Positive for fever. Negative for activity change, appetite change, chills, diaphoresis and fatigue.  Cardiovascular:  Negative for leg swelling.  Gastrointestinal:  Positive for abdominal pain. Negative for abdominal distention, nausea and vomiting.  Endocrine: Negative for polyuria.  Genitourinary:  Positive for dysuria, frequency and urgency. Negative for difficulty urinating, dyspareunia, enuresis, flank pain, hematuria, menstrual problem, pelvic pain, vaginal bleeding, vaginal discharge and vaginal pain.  Musculoskeletal:  Positive for back pain. Negative for myalgias.  Skin:  Negative for rash.  Allergic/Immunologic: Negative for immunocompromised state.  Neurological:  Negative for dizziness and weakness.  Hematological:  Does not bruise/bleed easily.  Psychiatric/Behavioral:  Negative for confusion.     Patient History  Allergies: Allergies  Allergen Reactions  . Loratadine-Pseudoephedrine Er Other    Sudafed = palpitations  loratadine / pseudoephedrine  . Pentazocine Other, Rash and Unknown    hallucinations  pentazocine lactate  . Thiethylperazine Hives and Other    hives, eyes roll back, SOB  thiethylperazine maleate  . Wound Dressing Adhesive Rash    Other reaction(s): Other (See Comments)  rash, blisters  Blisters - Paper tape ok to use   Blisters - Paper tape ok to use    Other reaction(s): Other (See Comments) rash, blisters  . Desloratadine Unknown  . Latex Other  . Loratadine Other, Rash  and Unknown    History reviewed. No pertinent past medical history. History reviewed. No pertinent surgical history. Social History   Socioeconomic History  . Marital status: Single    Spouse name: Not on file  . Number of children: Not on file  . Years of education: Not on file  . Highest education level: Not on file  Occupational History  . Not on file  Tobacco Use  . Smoking status: Never  . Smokeless tobacco: Never  Vaping Use  . Vaping status: Never Used  Substance and Sexual Activity  . Alcohol use: Never  . Drug use: Never  . Sexual activity: Defer  Other Topics Concern  . Not on file  Social History Narrative  . Not on file   History reviewed. No pertinent family history. No current outpatient medications on file prior to visit.   No current facility-administered medications on file prior to visit.    Objective  Vitals:   06/22/24 1908  BP: 138/79  BP Location: Left arm  Patient Position: Sitting  Pulse: 99  Resp: 20  Temp: 36.8 C (98.3 F)  TempSrc: Oral  SpO2: 100%  Weight: 86.6 kg  Height: 5' 4  PainSc: 0-No pain        OBGYN/Pregnancy Status: Postmenopausal      No results found.  Physical Exam  Results for orders placed or performed in visit on 06/22/24  POCT urinalysis dipstick manually resulted  Component Result   Color, UA Yellow   Clarity, UA Clear   Glucose, UA Negative   Bilirubin, UA Negative   Ketones, UA Negative   Spec Grav, UA 1.015   Blood, UA  Negative   pH, UA 5.5   Protein, UA Negative   Urobilinogen, UA Negative   Leukocytes, UA Negative   Nitrite, UA Negative       Procedures MDM:     1 Acute illness with systemic symptoms     2+ Stable chronic illnesses     Review of any test results: Two     Risk:: Moderate            Assessment/Plan Diagnoses and all orders for this visit:  Dysuria -     Urine Culture, Routine -     nitrofurantoin, macrocrystal-monohydrate, (Macrobid) 100 MG capsule;  Take 1 capsule (100 mg total) by mouth in the morning and 1 capsule (100 mg total) in the evening. Do all this for 10 days.  Other orders -     POCT urinalysis dipstick manually resulted     Disposition Status: Home  Patient Instructions  Hydrate well! Push fluids! Urinate frequently!  You may take Azo or Cystex to help with the discomfort of urination.  Take the antibiotic as directed.  Continue all of your regular medications as previously prescribed.  We will call you in a few day with your lab results.   If your urine culture is negative you should consider follow up with a urologist.   Progress note signed by Karen Belvin, PA on 06/22/24 at  7:28 PM

## 2024-06-22 NOTE — Progress Notes (Signed)
 Meredith Wu 63 year old female complains of reoccurring UTI symptoms had symptoms over a week ago was placed on medication symptoms started back on Tuesday 06/18/2024.
